# Patient Record
Sex: Female | Born: 1935 | Race: White | Hispanic: No | Smoking: Former smoker
Health system: Southern US, Community
[De-identification: ages and names within clinical notes are randomized; demographics above are authoritative.]

## PROBLEM LIST (undated history)

## (undated) DIAGNOSIS — D649 Anemia, unspecified: Secondary | ICD-10-CM

## (undated) DIAGNOSIS — E039 Hypothyroidism, unspecified: Secondary | ICD-10-CM

## (undated) DIAGNOSIS — Z8679 Personal history of other diseases of the circulatory system: Secondary | ICD-10-CM

## (undated) DIAGNOSIS — R739 Hyperglycemia, unspecified: Secondary | ICD-10-CM

## (undated) DIAGNOSIS — I509 Heart failure, unspecified: Secondary | ICD-10-CM

## (undated) DIAGNOSIS — N289 Disorder of kidney and ureter, unspecified: Secondary | ICD-10-CM

## (undated) HISTORY — DX: Hypothyroidism, unspecified: E03.9

## (undated) HISTORY — DX: Anemia, unspecified: D64.9

## (undated) HISTORY — DX: Heart failure, unspecified: I50.9

## (undated) HISTORY — DX: Disorder of kidney and ureter, unspecified: N28.9

## (undated) HISTORY — DX: Hyperglycemia, unspecified: R73.9

## (undated) HISTORY — DX: Personal history of other diseases of the circulatory system: Z86.79

---

## 2005-03-17 ENCOUNTER — Ambulatory Visit: Payer: Self-pay | Admitting: Cardiology

## 2005-03-23 ENCOUNTER — Ambulatory Visit (HOSPITAL_COMMUNITY): Admission: RE | Admit: 2005-03-23 | Discharge: 2005-03-23 | Payer: Self-pay | Admitting: Cardiology

## 2005-04-11 ENCOUNTER — Emergency Department (HOSPITAL_COMMUNITY): Admission: EM | Admit: 2005-04-11 | Discharge: 2005-04-11 | Payer: Self-pay | Admitting: Emergency Medicine

## 2005-04-11 ENCOUNTER — Emergency Department: Payer: Self-pay | Admitting: Emergency Medicine

## 2005-04-15 ENCOUNTER — Ambulatory Visit: Payer: Self-pay | Admitting: Family Medicine

## 2005-05-15 ENCOUNTER — Ambulatory Visit: Payer: Self-pay | Admitting: Family Medicine

## 2005-05-27 ENCOUNTER — Ambulatory Visit: Payer: Self-pay

## 2005-06-16 ENCOUNTER — Ambulatory Visit: Payer: Self-pay | Admitting: Family Medicine

## 2005-06-22 ENCOUNTER — Ambulatory Visit: Payer: Self-pay

## 2005-07-07 ENCOUNTER — Ambulatory Visit: Payer: Self-pay | Admitting: Family Medicine

## 2005-07-09 ENCOUNTER — Ambulatory Visit (HOSPITAL_COMMUNITY): Admission: RE | Admit: 2005-07-09 | Discharge: 2005-07-09 | Payer: Self-pay | Admitting: Family Medicine

## 2005-07-09 ENCOUNTER — Encounter (HOSPITAL_COMMUNITY): Admission: RE | Admit: 2005-07-09 | Discharge: 2005-08-08 | Payer: Self-pay | Admitting: Family Medicine

## 2005-07-09 ENCOUNTER — Ambulatory Visit: Payer: Self-pay | Admitting: Psychology

## 2005-08-03 ENCOUNTER — Ambulatory Visit: Payer: Self-pay | Admitting: Cardiology

## 2005-08-14 ENCOUNTER — Emergency Department (HOSPITAL_COMMUNITY): Admission: EM | Admit: 2005-08-14 | Discharge: 2005-08-14 | Payer: Self-pay | Admitting: Emergency Medicine

## 2005-08-14 ENCOUNTER — Ambulatory Visit: Payer: Self-pay | Admitting: Family Medicine

## 2005-08-17 ENCOUNTER — Ambulatory Visit: Payer: Self-pay | Admitting: Family Medicine

## 2005-08-19 ENCOUNTER — Ambulatory Visit: Payer: Self-pay | Admitting: Cardiology

## 2005-08-20 ENCOUNTER — Ambulatory Visit (HOSPITAL_COMMUNITY): Admission: RE | Admit: 2005-08-20 | Discharge: 2005-08-20 | Payer: Self-pay | Admitting: Family Medicine

## 2005-08-25 ENCOUNTER — Ambulatory Visit: Payer: Self-pay | Admitting: Cardiology

## 2005-08-26 ENCOUNTER — Ambulatory Visit: Payer: Self-pay | Admitting: Cardiology

## 2005-08-26 ENCOUNTER — Ambulatory Visit (HOSPITAL_COMMUNITY): Admission: RE | Admit: 2005-08-26 | Discharge: 2005-08-26 | Payer: Self-pay | Admitting: Cardiology

## 2005-08-26 ENCOUNTER — Ambulatory Visit: Payer: Self-pay | Admitting: Psychology

## 2005-09-09 ENCOUNTER — Ambulatory Visit (HOSPITAL_COMMUNITY): Admission: RE | Admit: 2005-09-09 | Discharge: 2005-09-09 | Payer: Self-pay | Admitting: Cardiology

## 2005-09-09 ENCOUNTER — Ambulatory Visit: Payer: Self-pay | Admitting: *Deleted

## 2005-09-18 ENCOUNTER — Ambulatory Visit: Payer: Self-pay | Admitting: Internal Medicine

## 2005-09-18 ENCOUNTER — Ambulatory Visit: Payer: Self-pay | Admitting: Family Medicine

## 2005-12-08 ENCOUNTER — Ambulatory Visit: Payer: Self-pay | Admitting: Family Medicine

## 2006-01-08 ENCOUNTER — Ambulatory Visit: Payer: Self-pay | Admitting: Family Medicine

## 2006-02-11 ENCOUNTER — Emergency Department (HOSPITAL_COMMUNITY): Admission: EM | Admit: 2006-02-11 | Discharge: 2006-02-11 | Payer: Self-pay | Admitting: Emergency Medicine

## 2006-02-11 ENCOUNTER — Ambulatory Visit: Payer: Self-pay | Admitting: Family Medicine

## 2006-02-18 ENCOUNTER — Ambulatory Visit: Payer: Self-pay | Admitting: Cardiology

## 2006-03-04 ENCOUNTER — Ambulatory Visit (HOSPITAL_COMMUNITY): Admission: RE | Admit: 2006-03-04 | Discharge: 2006-03-04 | Payer: Self-pay | Admitting: Obstetrics & Gynecology

## 2006-03-11 ENCOUNTER — Ambulatory Visit: Payer: Self-pay | Admitting: Cardiology

## 2006-03-17 ENCOUNTER — Ambulatory Visit: Admission: RE | Admit: 2006-03-17 | Discharge: 2006-03-17 | Payer: Self-pay | Admitting: Gynecologic Oncology

## 2006-03-23 ENCOUNTER — Inpatient Hospital Stay (HOSPITAL_COMMUNITY): Admission: RE | Admit: 2006-03-23 | Discharge: 2006-03-26 | Payer: Self-pay | Admitting: Obstetrics & Gynecology

## 2006-04-02 ENCOUNTER — Emergency Department (HOSPITAL_COMMUNITY): Admission: EM | Admit: 2006-04-02 | Discharge: 2006-04-02 | Payer: Self-pay | Admitting: Emergency Medicine

## 2006-04-05 ENCOUNTER — Ambulatory Visit: Payer: Self-pay | Admitting: Family Medicine

## 2006-04-19 ENCOUNTER — Ambulatory Visit: Payer: Self-pay | Admitting: Family Medicine

## 2006-04-29 ENCOUNTER — Ambulatory Visit: Payer: Self-pay | Admitting: Family Medicine

## 2006-05-03 ENCOUNTER — Ambulatory Visit: Payer: Self-pay | Admitting: Family Medicine

## 2006-05-04 ENCOUNTER — Ambulatory Visit: Admission: RE | Admit: 2006-05-04 | Discharge: 2006-05-04 | Payer: Self-pay | Admitting: Gynecologic Oncology

## 2006-05-05 ENCOUNTER — Encounter: Admission: RE | Admit: 2006-05-05 | Discharge: 2006-05-05 | Payer: Self-pay | Admitting: Oncology

## 2006-05-05 ENCOUNTER — Ambulatory Visit (HOSPITAL_COMMUNITY): Payer: Self-pay | Admitting: Oncology

## 2006-05-05 ENCOUNTER — Encounter (HOSPITAL_COMMUNITY): Admission: RE | Admit: 2006-05-05 | Discharge: 2006-06-04 | Payer: Self-pay | Admitting: Oncology

## 2006-05-10 ENCOUNTER — Ambulatory Visit (HOSPITAL_COMMUNITY): Admission: RE | Admit: 2006-05-10 | Discharge: 2006-05-10 | Payer: Self-pay | Admitting: Oncology

## 2006-05-11 ENCOUNTER — Ambulatory Visit (HOSPITAL_COMMUNITY): Admission: RE | Admit: 2006-05-11 | Discharge: 2006-05-11 | Payer: Self-pay | Admitting: General Surgery

## 2006-05-20 ENCOUNTER — Other Ambulatory Visit: Payer: Self-pay

## 2006-05-20 ENCOUNTER — Emergency Department: Payer: Self-pay | Admitting: Emergency Medicine

## 2006-05-25 ENCOUNTER — Emergency Department (HOSPITAL_COMMUNITY): Admission: EM | Admit: 2006-05-25 | Discharge: 2006-05-25 | Payer: Self-pay | Admitting: Emergency Medicine

## 2006-05-25 ENCOUNTER — Ambulatory Visit: Payer: Self-pay | Admitting: Cardiology

## 2006-06-15 ENCOUNTER — Inpatient Hospital Stay (HOSPITAL_COMMUNITY): Admission: AD | Admit: 2006-06-15 | Discharge: 2006-06-24 | Payer: Self-pay | Admitting: Internal Medicine

## 2006-06-16 ENCOUNTER — Ambulatory Visit: Payer: Self-pay | Admitting: *Deleted

## 2006-07-19 ENCOUNTER — Ambulatory Visit: Payer: Self-pay | Admitting: Internal Medicine

## 2006-07-28 ENCOUNTER — Ambulatory Visit: Payer: Self-pay | Admitting: Internal Medicine

## 2006-08-28 ENCOUNTER — Ambulatory Visit: Payer: Self-pay | Admitting: Internal Medicine

## 2006-09-17 ENCOUNTER — Ambulatory Visit: Payer: Self-pay | Admitting: Internal Medicine

## 2006-09-27 ENCOUNTER — Ambulatory Visit: Payer: Self-pay | Admitting: Internal Medicine

## 2006-10-28 ENCOUNTER — Ambulatory Visit: Payer: Self-pay | Admitting: Internal Medicine

## 2006-11-27 ENCOUNTER — Ambulatory Visit: Payer: Self-pay | Admitting: Internal Medicine

## 2006-12-28 ENCOUNTER — Ambulatory Visit: Payer: Self-pay | Admitting: Internal Medicine

## 2007-01-28 ENCOUNTER — Ambulatory Visit: Payer: Self-pay | Admitting: Internal Medicine

## 2007-02-26 ENCOUNTER — Ambulatory Visit: Payer: Self-pay | Admitting: Internal Medicine

## 2007-03-29 ENCOUNTER — Ambulatory Visit: Payer: Self-pay | Admitting: Internal Medicine

## 2007-04-04 ENCOUNTER — Ambulatory Visit: Payer: Self-pay | Admitting: Internal Medicine

## 2007-04-06 ENCOUNTER — Ambulatory Visit: Payer: Self-pay | Admitting: Internal Medicine

## 2007-04-28 ENCOUNTER — Ambulatory Visit: Payer: Self-pay | Admitting: Internal Medicine

## 2007-05-18 ENCOUNTER — Emergency Department: Payer: Self-pay | Admitting: Emergency Medicine

## 2007-06-28 ENCOUNTER — Ambulatory Visit: Payer: Self-pay | Admitting: Internal Medicine

## 2007-07-06 ENCOUNTER — Ambulatory Visit: Payer: Self-pay | Admitting: Internal Medicine

## 2007-07-29 ENCOUNTER — Ambulatory Visit: Payer: Self-pay | Admitting: Internal Medicine

## 2007-08-27 ENCOUNTER — Emergency Department: Payer: Self-pay | Admitting: Emergency Medicine

## 2007-10-18 ENCOUNTER — Ambulatory Visit: Payer: Self-pay | Admitting: Internal Medicine

## 2007-10-27 ENCOUNTER — Ambulatory Visit: Payer: Self-pay | Admitting: Internal Medicine

## 2007-10-29 ENCOUNTER — Ambulatory Visit: Payer: Self-pay | Admitting: Internal Medicine

## 2007-11-28 ENCOUNTER — Ambulatory Visit: Payer: Self-pay | Admitting: Internal Medicine

## 2007-11-29 ENCOUNTER — Ambulatory Visit: Payer: Self-pay | Admitting: Internal Medicine

## 2007-12-29 ENCOUNTER — Encounter: Payer: Self-pay | Admitting: Family Medicine

## 2008-01-02 ENCOUNTER — Ambulatory Visit: Payer: Self-pay | Admitting: Internal Medicine

## 2008-01-04 ENCOUNTER — Ambulatory Visit: Payer: Self-pay | Admitting: Internal Medicine

## 2008-01-29 ENCOUNTER — Ambulatory Visit: Payer: Self-pay | Admitting: Internal Medicine

## 2008-01-30 ENCOUNTER — Emergency Department: Payer: Self-pay | Admitting: Internal Medicine

## 2008-02-01 ENCOUNTER — Other Ambulatory Visit: Payer: Self-pay

## 2008-02-01 ENCOUNTER — Emergency Department: Payer: Self-pay | Admitting: Emergency Medicine

## 2008-03-07 ENCOUNTER — Ambulatory Visit: Payer: Self-pay | Admitting: Ophthalmology

## 2008-03-07 ENCOUNTER — Other Ambulatory Visit: Payer: Self-pay

## 2008-03-09 ENCOUNTER — Ambulatory Visit: Payer: Self-pay | Admitting: Internal Medicine

## 2008-03-13 ENCOUNTER — Inpatient Hospital Stay: Payer: Self-pay | Admitting: Internal Medicine

## 2008-03-14 ENCOUNTER — Other Ambulatory Visit: Payer: Self-pay

## 2008-03-28 ENCOUNTER — Ambulatory Visit: Payer: Self-pay | Admitting: Internal Medicine

## 2008-04-09 ENCOUNTER — Ambulatory Visit: Payer: Self-pay | Admitting: Internal Medicine

## 2008-04-26 ENCOUNTER — Other Ambulatory Visit: Payer: Self-pay

## 2008-04-26 ENCOUNTER — Emergency Department: Payer: Self-pay | Admitting: Emergency Medicine

## 2008-04-30 ENCOUNTER — Other Ambulatory Visit: Payer: Self-pay

## 2008-04-30 ENCOUNTER — Emergency Department: Payer: Self-pay | Admitting: Emergency Medicine

## 2008-05-01 ENCOUNTER — Ambulatory Visit: Payer: Self-pay | Admitting: Ophthalmology

## 2008-05-02 ENCOUNTER — Ambulatory Visit: Payer: Self-pay | Admitting: Internal Medicine

## 2008-05-04 ENCOUNTER — Ambulatory Visit: Payer: Self-pay | Admitting: Otolaryngology

## 2008-05-09 ENCOUNTER — Ambulatory Visit: Payer: Self-pay | Admitting: Ophthalmology

## 2008-06-04 ENCOUNTER — Ambulatory Visit: Payer: Self-pay | Admitting: Internal Medicine

## 2008-06-11 ENCOUNTER — Ambulatory Visit: Payer: Self-pay | Admitting: Internal Medicine

## 2008-06-18 ENCOUNTER — Inpatient Hospital Stay: Payer: Self-pay | Admitting: Internal Medicine

## 2008-06-18 ENCOUNTER — Other Ambulatory Visit: Payer: Self-pay

## 2008-06-27 ENCOUNTER — Ambulatory Visit: Payer: Self-pay | Admitting: Internal Medicine

## 2008-08-24 ENCOUNTER — Inpatient Hospital Stay: Payer: Self-pay | Admitting: Internal Medicine

## 2008-09-30 ENCOUNTER — Inpatient Hospital Stay: Payer: Self-pay | Admitting: Internal Medicine

## 2008-09-30 ENCOUNTER — Other Ambulatory Visit: Payer: Self-pay

## 2009-04-17 ENCOUNTER — Ambulatory Visit: Payer: Self-pay | Admitting: Internal Medicine

## 2009-04-27 ENCOUNTER — Ambulatory Visit: Payer: Self-pay | Admitting: Internal Medicine

## 2009-06-27 ENCOUNTER — Ambulatory Visit: Payer: Self-pay | Admitting: Internal Medicine

## 2009-07-04 ENCOUNTER — Ambulatory Visit: Payer: Self-pay | Admitting: Internal Medicine

## 2009-07-28 ENCOUNTER — Ambulatory Visit: Payer: Self-pay | Admitting: Internal Medicine

## 2009-08-27 IMAGING — CR DG CHEST 1V PORT
1 series · 1 of 1 positions shown · non-contrast
Comparison: none

REASON FOR EXAM: Chest Pain
COMMENTS:

[view not recorded]
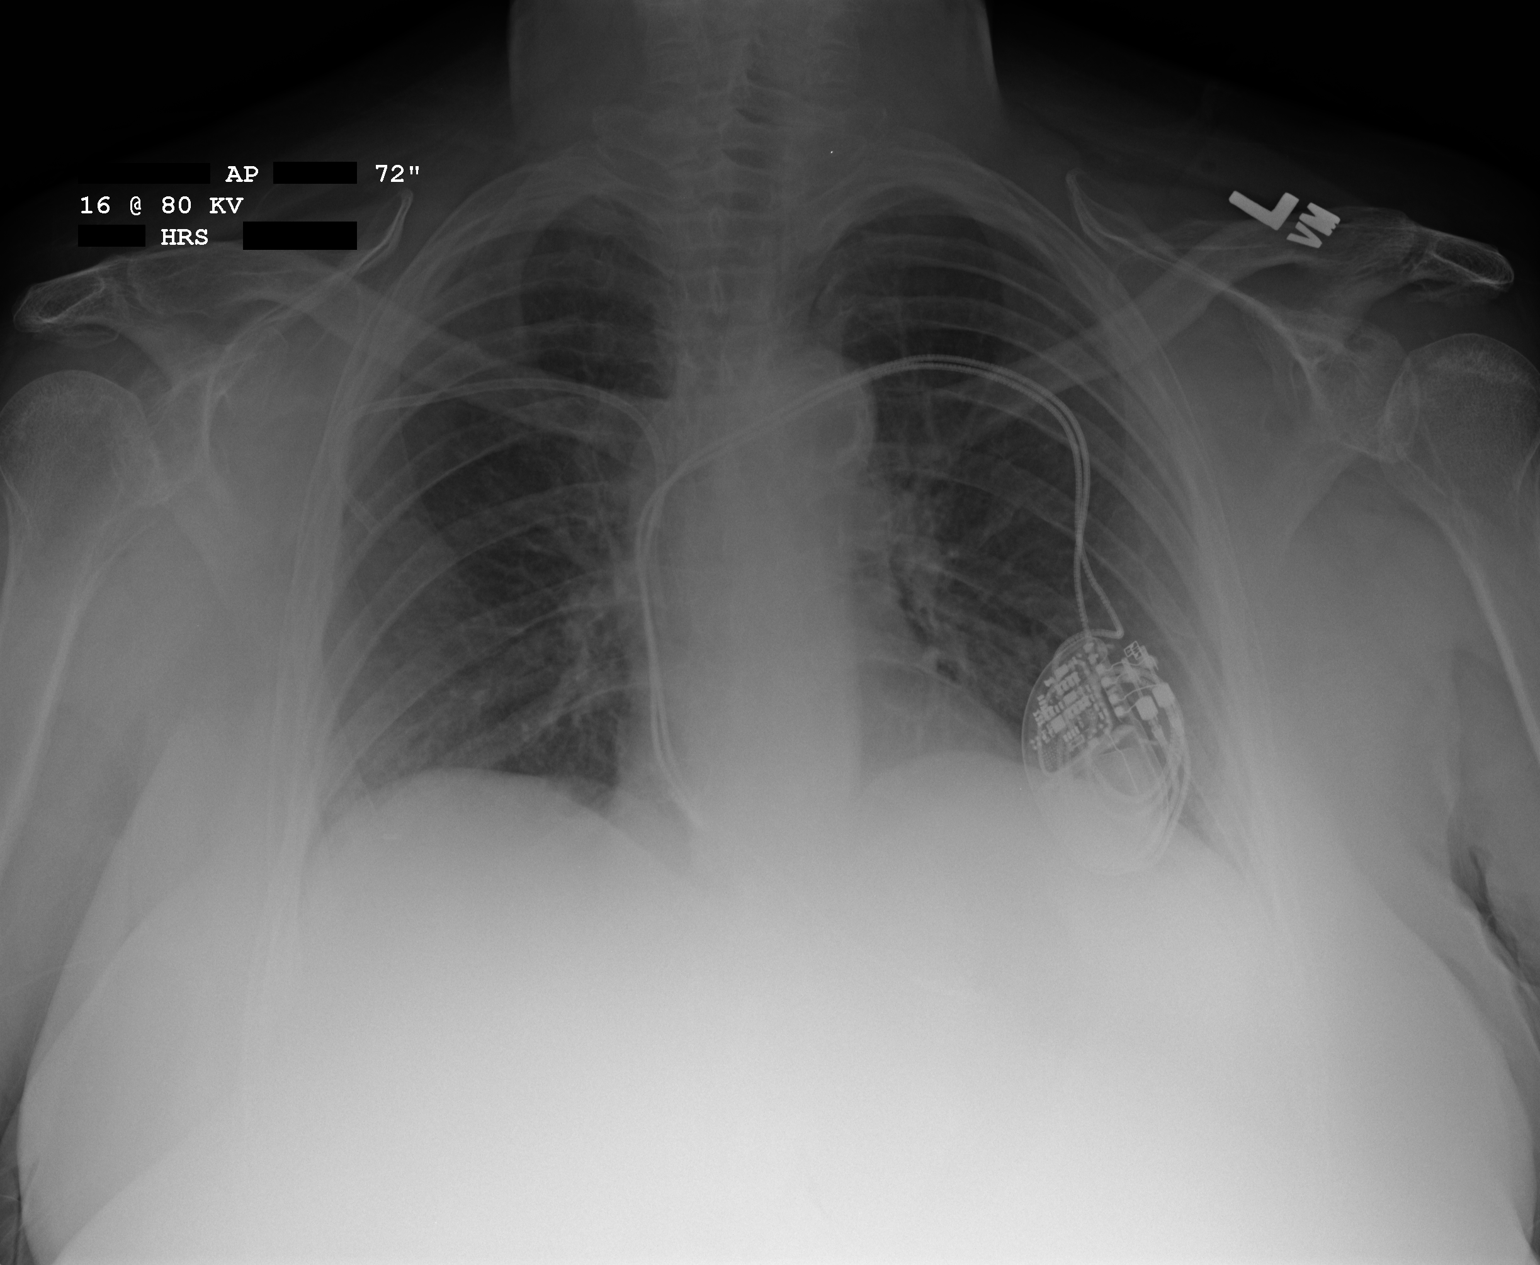

[1 of 1 positions shown; findings below may reference images not displayed]

PROCEDURE:     DXR - DXR PORTABLE CHEST SINGLE VIEW  - June 18, 2008  [DATE]

RESULT:     Comparison is made to a prior study dated 04/26/08.

The patient has taken a shallow inspiration. Otherwise, there is no evidence
of focal infiltrates, effusions or edema.  A LEFT sided pectoralis pacing
unit is appreciated.  The cardiac silhouette is within normal limits.  The
visualized bony skeleton is unremarkable.  A RIGHT sided central venous
catheter is appreciated with the tip projected in the region of the superior
vena cava.
IMPRESSION: Shallow inspiration without evidence of acute cardiopulmonary disease.

## 2009-09-09 ENCOUNTER — Ambulatory Visit: Payer: Self-pay | Admitting: Internal Medicine

## 2009-09-15 ENCOUNTER — Emergency Department: Payer: Self-pay | Admitting: Unknown Physician Specialty

## 2009-09-16 ENCOUNTER — Emergency Department: Payer: Self-pay | Admitting: Emergency Medicine

## 2009-09-27 ENCOUNTER — Ambulatory Visit: Payer: Self-pay | Admitting: Internal Medicine

## 2009-10-28 ENCOUNTER — Ambulatory Visit: Payer: Self-pay | Admitting: Internal Medicine

## 2009-11-27 ENCOUNTER — Ambulatory Visit: Payer: Self-pay | Admitting: Internal Medicine

## 2009-12-28 ENCOUNTER — Ambulatory Visit: Payer: Self-pay | Admitting: Internal Medicine

## 2010-01-21 ENCOUNTER — Ambulatory Visit: Payer: Self-pay | Admitting: Internal Medicine

## 2010-01-30 ENCOUNTER — Ambulatory Visit: Payer: Self-pay | Admitting: Internal Medicine

## 2010-01-31 ENCOUNTER — Ambulatory Visit: Payer: Self-pay | Admitting: Internal Medicine

## 2010-02-25 ENCOUNTER — Ambulatory Visit: Payer: Self-pay | Admitting: Internal Medicine

## 2010-03-18 ENCOUNTER — Ambulatory Visit: Payer: Self-pay | Admitting: Otolaryngology

## 2010-04-03 ENCOUNTER — Ambulatory Visit: Payer: Self-pay | Admitting: Internal Medicine

## 2010-04-05 ENCOUNTER — Inpatient Hospital Stay: Payer: Self-pay | Admitting: Orthopedic Surgery

## 2010-04-09 ENCOUNTER — Ambulatory Visit: Payer: Self-pay | Admitting: Cardiology

## 2010-04-27 ENCOUNTER — Ambulatory Visit: Payer: Self-pay | Admitting: Internal Medicine

## 2010-05-28 ENCOUNTER — Ambulatory Visit: Payer: Self-pay | Admitting: Internal Medicine

## 2010-06-27 ENCOUNTER — Ambulatory Visit: Payer: Self-pay | Admitting: Internal Medicine

## 2010-08-08 ENCOUNTER — Ambulatory Visit: Payer: Self-pay | Admitting: Otolaryngology

## 2010-08-21 ENCOUNTER — Ambulatory Visit: Payer: Self-pay | Admitting: Internal Medicine

## 2010-08-28 ENCOUNTER — Ambulatory Visit: Payer: Self-pay | Admitting: Internal Medicine

## 2010-09-08 ENCOUNTER — Ambulatory Visit: Payer: Self-pay | Admitting: Otolaryngology

## 2010-09-26 ENCOUNTER — Ambulatory Visit: Payer: Self-pay | Admitting: Internal Medicine

## 2010-09-27 ENCOUNTER — Ambulatory Visit: Payer: Self-pay | Admitting: Internal Medicine

## 2010-10-28 ENCOUNTER — Ambulatory Visit: Payer: Self-pay | Admitting: Internal Medicine

## 2010-11-27 ENCOUNTER — Ambulatory Visit: Payer: Self-pay | Admitting: Internal Medicine

## 2010-12-28 ENCOUNTER — Ambulatory Visit: Payer: Self-pay | Admitting: Internal Medicine

## 2011-01-09 ENCOUNTER — Emergency Department: Payer: Self-pay | Admitting: Emergency Medicine

## 2011-01-18 ENCOUNTER — Encounter: Payer: Self-pay | Admitting: Family Medicine

## 2011-01-22 ENCOUNTER — Ambulatory Visit: Payer: Self-pay | Admitting: Internal Medicine

## 2011-01-26 ENCOUNTER — Emergency Department: Payer: Self-pay | Admitting: Unknown Physician Specialty

## 2011-01-27 NOTE — Letter (Signed)
Summary: rpc chart  rpc chart   Imported By: Curtis Sites 09/29/2010 09:57:23  _____________________________________________________________________  External Attachment:    Type:   Image     Comment:   External Document

## 2011-01-28 ENCOUNTER — Ambulatory Visit: Payer: Self-pay | Admitting: Internal Medicine

## 2011-02-26 ENCOUNTER — Ambulatory Visit: Payer: Self-pay | Admitting: Internal Medicine

## 2011-03-29 ENCOUNTER — Ambulatory Visit: Payer: Self-pay | Admitting: Internal Medicine

## 2011-04-28 ENCOUNTER — Ambulatory Visit: Payer: Self-pay | Admitting: Internal Medicine

## 2011-05-29 ENCOUNTER — Ambulatory Visit: Payer: Self-pay | Admitting: Internal Medicine

## 2011-06-28 ENCOUNTER — Ambulatory Visit: Payer: Self-pay | Admitting: Internal Medicine

## 2011-07-29 ENCOUNTER — Ambulatory Visit: Payer: Self-pay | Admitting: Internal Medicine

## 2011-08-29 ENCOUNTER — Ambulatory Visit: Payer: Self-pay

## 2011-08-29 ENCOUNTER — Ambulatory Visit: Payer: Self-pay | Admitting: Internal Medicine

## 2011-09-28 ENCOUNTER — Ambulatory Visit: Payer: Self-pay

## 2011-10-20 ENCOUNTER — Ambulatory Visit: Payer: Self-pay | Admitting: Internal Medicine

## 2011-10-26 ENCOUNTER — Emergency Department: Payer: Self-pay | Admitting: Emergency Medicine

## 2011-10-29 ENCOUNTER — Ambulatory Visit: Payer: Self-pay | Admitting: Internal Medicine

## 2011-11-28 ENCOUNTER — Ambulatory Visit: Payer: Self-pay | Admitting: Internal Medicine

## 2011-12-29 ENCOUNTER — Ambulatory Visit: Payer: Self-pay | Admitting: Internal Medicine

## 2012-01-21 ENCOUNTER — Emergency Department: Payer: Self-pay | Admitting: *Deleted

## 2012-01-21 LAB — CBC
HCT: 36.4 % (ref 35.0–47.0)
MCH: 29.5 pg (ref 26.0–34.0)
MCHC: 32.5 g/dL (ref 32.0–36.0)
RBC: 4.01 10*6/uL (ref 3.80–5.20)
RDW: 15.5 % — ABNORMAL HIGH (ref 11.5–14.5)
WBC: 8.6 10*3/uL (ref 3.6–11.0)

## 2012-01-21 LAB — PRO B NATRIURETIC PEPTIDE: B-Type Natriuretic Peptide: 6221 pg/mL — ABNORMAL HIGH (ref 0–450)

## 2012-01-21 LAB — COMPREHENSIVE METABOLIC PANEL
Albumin: 3.1 g/dL — ABNORMAL LOW (ref 3.4–5.0)
Alkaline Phosphatase: 117 U/L (ref 50–136)
Anion Gap: 8 (ref 7–16)
BUN: 12 mg/dL (ref 7–18)
Bilirubin,Total: 0.7 mg/dL (ref 0.2–1.0)
Calcium, Total: 9.2 mg/dL (ref 8.5–10.1)
Creatinine: 1.05 mg/dL (ref 0.60–1.30)
EGFR (African American): >60
EGFR (Non-African Amer.): 54 — ABNORMAL LOW
Glucose: 141 mg/dL — ABNORMAL HIGH (ref 65–99)
Osmolality: 278 (ref 275–301)
Potassium: 4.7 mmol/L (ref 3.5–5.1)
SGOT(AST): 30 U/L (ref 15–37)
Sodium: 138 mmol/L (ref 136–145)
Total Protein: 7.5 g/dL (ref 6.4–8.2)

## 2012-01-21 LAB — TROPONIN I: Troponin-I: <0.02 ng/mL

## 2012-01-21 LAB — CK TOTAL AND CKMB (NOT AT ARMC): CK, Total: 214 U/L (ref 21–215)

## 2012-01-29 ENCOUNTER — Ambulatory Visit: Payer: Self-pay | Admitting: Internal Medicine

## 2012-02-26 ENCOUNTER — Ambulatory Visit: Payer: Self-pay | Admitting: Internal Medicine

## 2012-03-29 ENCOUNTER — Ambulatory Visit: Payer: Self-pay | Admitting: Internal Medicine

## 2012-04-08 ENCOUNTER — Emergency Department: Payer: Self-pay | Admitting: Emergency Medicine

## 2012-04-14 LAB — CBC WITH DIFFERENTIAL/PLATELET
Basophil #: 0 10*3/uL (ref 0.0–0.1)
Basophil %: 0.6 %
Eosinophil #: 0.1 10*3/uL (ref 0.0–0.7)
Eosinophil %: 1.9 %
HGB: 8.9 g/dL — ABNORMAL LOW (ref 12.0–16.0)
MCH: 28.6 pg (ref 26.0–34.0)
MCV: 90 fL (ref 80–100)
Monocyte #: 0.8 x10 3/mm (ref 0.2–0.9)
Monocyte %: 11.9 %
Neutrophil #: 4.9 10*3/uL (ref 1.4–6.5)
Platelet: 293 10*3/uL (ref 150–440)
RBC: 3.1 10*6/uL — ABNORMAL LOW (ref 3.80–5.20)

## 2012-04-14 LAB — COMPREHENSIVE METABOLIC PANEL
BUN: 17 mg/dL (ref 7–18)
Bilirubin,Total: 0.5 mg/dL (ref 0.2–1.0)
Calcium, Total: 8.5 mg/dL (ref 8.5–10.1)
Co2: 28 mmol/L (ref 21–32)
EGFR (Non-African Amer.): 39 — ABNORMAL LOW
Glucose: 108 mg/dL — ABNORMAL HIGH (ref 65–99)
Potassium: 4.4 mmol/L (ref 3.5–5.1)
SGOT(AST): 26 U/L (ref 15–37)
SGPT (ALT): 22 U/L
Total Protein: 6.5 g/dL (ref 6.4–8.2)

## 2012-04-15 ENCOUNTER — Inpatient Hospital Stay: Payer: Self-pay | Admitting: Internal Medicine

## 2012-04-15 DIAGNOSIS — I059 Rheumatic mitral valve disease, unspecified: Secondary | ICD-10-CM

## 2012-04-15 LAB — URINALYSIS, COMPLETE
Bilirubin,UR: NEGATIVE
Nitrite: NEGATIVE
Ph: 5 (ref 4.5–8.0)
Protein: 30
RBC,UR: 21 /HPF (ref 0–5)
Specific Gravity: 1.023 (ref 1.003–1.030)
Squamous Epithelial: 1

## 2012-04-15 LAB — RETICULOCYTES
Absolute Retic Count: 0.067 10*6/uL (ref 0.024–0.084)
Reticulocyte: 2.14 % — ABNORMAL HIGH (ref 0.5–1.5)

## 2012-04-15 LAB — OCCULT BLOOD X 1 CARD TO LAB, STOOL: Occult Blood, Feces: NEGATIVE

## 2012-04-15 LAB — PROTIME-INR: INR: 3

## 2012-04-15 LAB — IRON AND TIBC
Iron: 98 ug/dL (ref 50–170)
Unbound Iron-Bind.Cap.: 227 ug/dL

## 2012-04-16 LAB — BASIC METABOLIC PANEL
Anion Gap: 10 (ref 7–16)
Calcium, Total: 8.6 mg/dL (ref 8.5–10.1)
Chloride: 98 mmol/L (ref 98–107)
Creatinine: 1.51 mg/dL — ABNORMAL HIGH (ref 0.60–1.30)
Osmolality: 290 (ref 275–301)
Potassium: 5.6 mmol/L — ABNORMAL HIGH (ref 3.5–5.1)
Sodium: 136 mmol/L (ref 136–145)

## 2012-04-16 LAB — CBC WITH DIFFERENTIAL/PLATELET
Basophil %: 0.4 %
Eosinophil %: 0.3 %
HCT: 28.7 % — ABNORMAL LOW (ref 35.0–47.0)
Lymphocyte %: 6.5 %
MCH: 28.6 pg (ref 26.0–34.0)
MCHC: 31.7 g/dL — ABNORMAL LOW (ref 32.0–36.0)
Neutrophil #: 7.7 10*3/uL — ABNORMAL HIGH (ref 1.4–6.5)
Neutrophil %: 82.8 %
Platelet: 292 10*3/uL (ref 150–440)
RDW: 16.1 % — ABNORMAL HIGH (ref 11.5–14.5)
WBC: 9.4 10*3/uL (ref 3.6–11.0)

## 2012-04-16 LAB — HEMOGLOBIN A1C: Hemoglobin A1C: 7.8 % — ABNORMAL HIGH (ref 4.2–6.3)

## 2012-04-16 LAB — PROTIME-INR: INR: 2.6

## 2012-04-16 LAB — POTASSIUM: Potassium: 4.3 mmol/L (ref 3.5–5.1)

## 2012-04-16 LAB — URINE CULTURE

## 2012-04-17 LAB — BASIC METABOLIC PANEL
Anion Gap: 8 (ref 7–16)
BUN: 14 mg/dL (ref 7–18)
Chloride: 98 mmol/L (ref 98–107)
Co2: 34 mmol/L — ABNORMAL HIGH (ref 21–32)
Creatinine: 1.28 mg/dL (ref 0.60–1.30)
EGFR (African American): 47 — ABNORMAL LOW
Osmolality: 282 (ref 275–301)
Potassium: 3.6 mmol/L (ref 3.5–5.1)
Sodium: 140 mmol/L (ref 136–145)

## 2012-04-17 LAB — PROTIME-INR: INR: 2.7

## 2012-04-17 LAB — CBC WITH DIFFERENTIAL/PLATELET
Basophil #: 0 10*3/uL (ref 0.0–0.1)
Basophil %: 0.5 %
Eosinophil #: 0.2 10*3/uL (ref 0.0–0.7)
Eosinophil %: 3.2 %
Lymphocyte #: 1.7 10*3/uL (ref 1.0–3.6)
MCHC: 32 g/dL (ref 32.0–36.0)
MCV: 88 fL (ref 80–100)
RBC: 3.28 10*6/uL — ABNORMAL LOW (ref 3.80–5.20)
RDW: 15.6 % — ABNORMAL HIGH (ref 11.5–14.5)
WBC: 7 10*3/uL (ref 3.6–11.0)

## 2012-04-18 LAB — CBC WITH DIFFERENTIAL/PLATELET
Basophil #: 0.1 10*3/uL (ref 0.0–0.1)
Eosinophil %: 2.7 %
HCT: 30.7 % — ABNORMAL LOW (ref 35.0–47.0)
HGB: 9.7 g/dL — ABNORMAL LOW (ref 12.0–16.0)
Lymphocyte #: 0.9 10*3/uL — ABNORMAL LOW (ref 1.0–3.6)
MCH: 28.3 pg (ref 26.0–34.0)
MCHC: 31.7 g/dL — ABNORMAL LOW (ref 32.0–36.0)
MCV: 89 fL (ref 80–100)
Monocyte %: 11.3 %
Neutrophil #: 5 10*3/uL (ref 1.4–6.5)
Neutrophil %: 72 %
Platelet: 297 10*3/uL (ref 150–440)
RBC: 3.44 10*6/uL — ABNORMAL LOW (ref 3.80–5.20)

## 2012-04-18 LAB — BASIC METABOLIC PANEL
Anion Gap: 9 (ref 7–16)
Chloride: 97 mmol/L — ABNORMAL LOW (ref 98–107)
Co2: 32 mmol/L (ref 21–32)
Creatinine: 1.16 mg/dL (ref 0.60–1.30)
EGFR (Non-African Amer.): 45 — ABNORMAL LOW
Glucose: 262 mg/dL — ABNORMAL HIGH (ref 65–99)
Osmolality: 285 (ref 275–301)
Potassium: 4 mmol/L (ref 3.5–5.1)

## 2012-04-18 LAB — PROTIME-INR
INR: 2.7
Prothrombin Time: 29 secs — ABNORMAL HIGH (ref 11.5–14.7)

## 2012-04-19 LAB — PROTIME-INR
INR: 3.1
Prothrombin Time: 31.9 secs — ABNORMAL HIGH (ref 11.5–14.7)

## 2012-04-27 ENCOUNTER — Ambulatory Visit: Payer: Self-pay | Admitting: Internal Medicine

## 2012-04-29 ENCOUNTER — Encounter: Payer: Self-pay | Admitting: Cardiovascular Disease

## 2012-05-06 ENCOUNTER — Encounter: Payer: Self-pay | Admitting: Cardiovascular Disease

## 2012-05-09 ENCOUNTER — Encounter: Payer: Self-pay | Admitting: Cardiovascular Disease

## 2012-07-07 ENCOUNTER — Ambulatory Visit: Payer: Self-pay | Admitting: Internal Medicine

## 2012-07-07 LAB — CBC CANCER CENTER
Basophil #: 0 x10 3/mm (ref 0.0–0.1)
Basophil %: 0.7 %
HCT: 36.5 % (ref 35.0–47.0)
HGB: 11.8 g/dL — ABNORMAL LOW (ref 12.0–16.0)
Lymphocyte #: 1.5 x10 3/mm (ref 1.0–3.6)
Lymphocyte %: 29.3 %
MCHC: 32.2 g/dL (ref 32.0–36.0)
MCV: 87 fL (ref 80–100)
Monocyte %: 10.4 %
Neutrophil #: 2.9 x10 3/mm (ref 1.4–6.5)
RDW: 16 % — ABNORMAL HIGH (ref 11.5–14.5)
WBC: 5.1 x10 3/mm (ref 3.6–11.0)

## 2012-07-07 LAB — LACTATE DEHYDROGENASE: LDH: 221 U/L (ref 84–246)

## 2012-07-28 ENCOUNTER — Ambulatory Visit: Payer: Self-pay | Admitting: Internal Medicine

## 2012-09-05 ENCOUNTER — Ambulatory Visit: Payer: Self-pay | Admitting: Ophthalmology

## 2012-09-05 LAB — PROTIME-INR
INR: 1.4
Prothrombin Time: 17.5 secs — ABNORMAL HIGH (ref 11.5–14.7)

## 2012-09-05 LAB — CBC WITH DIFFERENTIAL/PLATELET
Basophil #: 0 10*3/uL (ref 0.0–0.1)
Basophil %: 0.6 %
Eosinophil #: 0.2 10*3/uL (ref 0.0–0.7)
HGB: 11.9 g/dL — ABNORMAL LOW (ref 12.0–16.0)
MCH: 29.8 pg (ref 26.0–34.0)
MCHC: 33.2 g/dL (ref 32.0–36.0)
Monocyte #: 0.8 x10 3/mm (ref 0.2–0.9)
Neutrophil #: 4.4 10*3/uL (ref 1.4–6.5)
Neutrophil %: 63.3 %
RDW: 17.3 % — ABNORMAL HIGH (ref 11.5–14.5)
WBC: 7 10*3/uL (ref 3.6–11.0)

## 2012-09-05 LAB — POTASSIUM: Potassium: 4.2 mmol/L (ref 3.5–5.1)

## 2012-09-07 ENCOUNTER — Ambulatory Visit: Payer: Self-pay | Admitting: Ophthalmology

## 2012-10-17 ENCOUNTER — Emergency Department: Payer: Self-pay | Admitting: Emergency Medicine

## 2012-10-17 LAB — URINALYSIS, COMPLETE
Bilirubin,UR: NEGATIVE
Nitrite: NEGATIVE
Ph: 5 (ref 4.5–8.0)
Protein: NEGATIVE
RBC,UR: 6 /HPF (ref 0–5)
Squamous Epithelial: 1

## 2012-10-17 LAB — CBC
HCT: 35 % (ref 35.0–47.0)
HGB: 11.6 g/dL — ABNORMAL LOW (ref 12.0–16.0)
MCH: 29.8 pg (ref 26.0–34.0)
MCHC: 33.2 g/dL (ref 32.0–36.0)
MCV: 90 fL (ref 80–100)
RDW: 15.6 % — ABNORMAL HIGH (ref 11.5–14.5)

## 2012-10-17 LAB — COMPREHENSIVE METABOLIC PANEL
Albumin: 2.7 g/dL — ABNORMAL LOW (ref 3.4–5.0)
Anion Gap: 7 (ref 7–16)
Calcium, Total: 9.2 mg/dL (ref 8.5–10.1)
Chloride: 105 mmol/L (ref 98–107)
Co2: 29 mmol/L (ref 21–32)
EGFR (African American): 53 — ABNORMAL LOW
EGFR (Non-African Amer.): 45 — ABNORMAL LOW
Osmolality: 285 (ref 275–301)
Potassium: 4.1 mmol/L (ref 3.5–5.1)
SGOT(AST): 23 U/L (ref 15–37)
Sodium: 141 mmol/L (ref 136–145)

## 2012-10-17 LAB — PROTIME-INR: INR: 2

## 2012-10-17 LAB — TROPONIN I: Troponin-I: <0.02 ng/mL

## 2013-06-23 IMAGING — CR DG CHEST 1V PORT
1 series · 1 of 1 positions shown · non-contrast
Comparison: none

REASON FOR EXAM: sob
COMMENTS:

[portable]
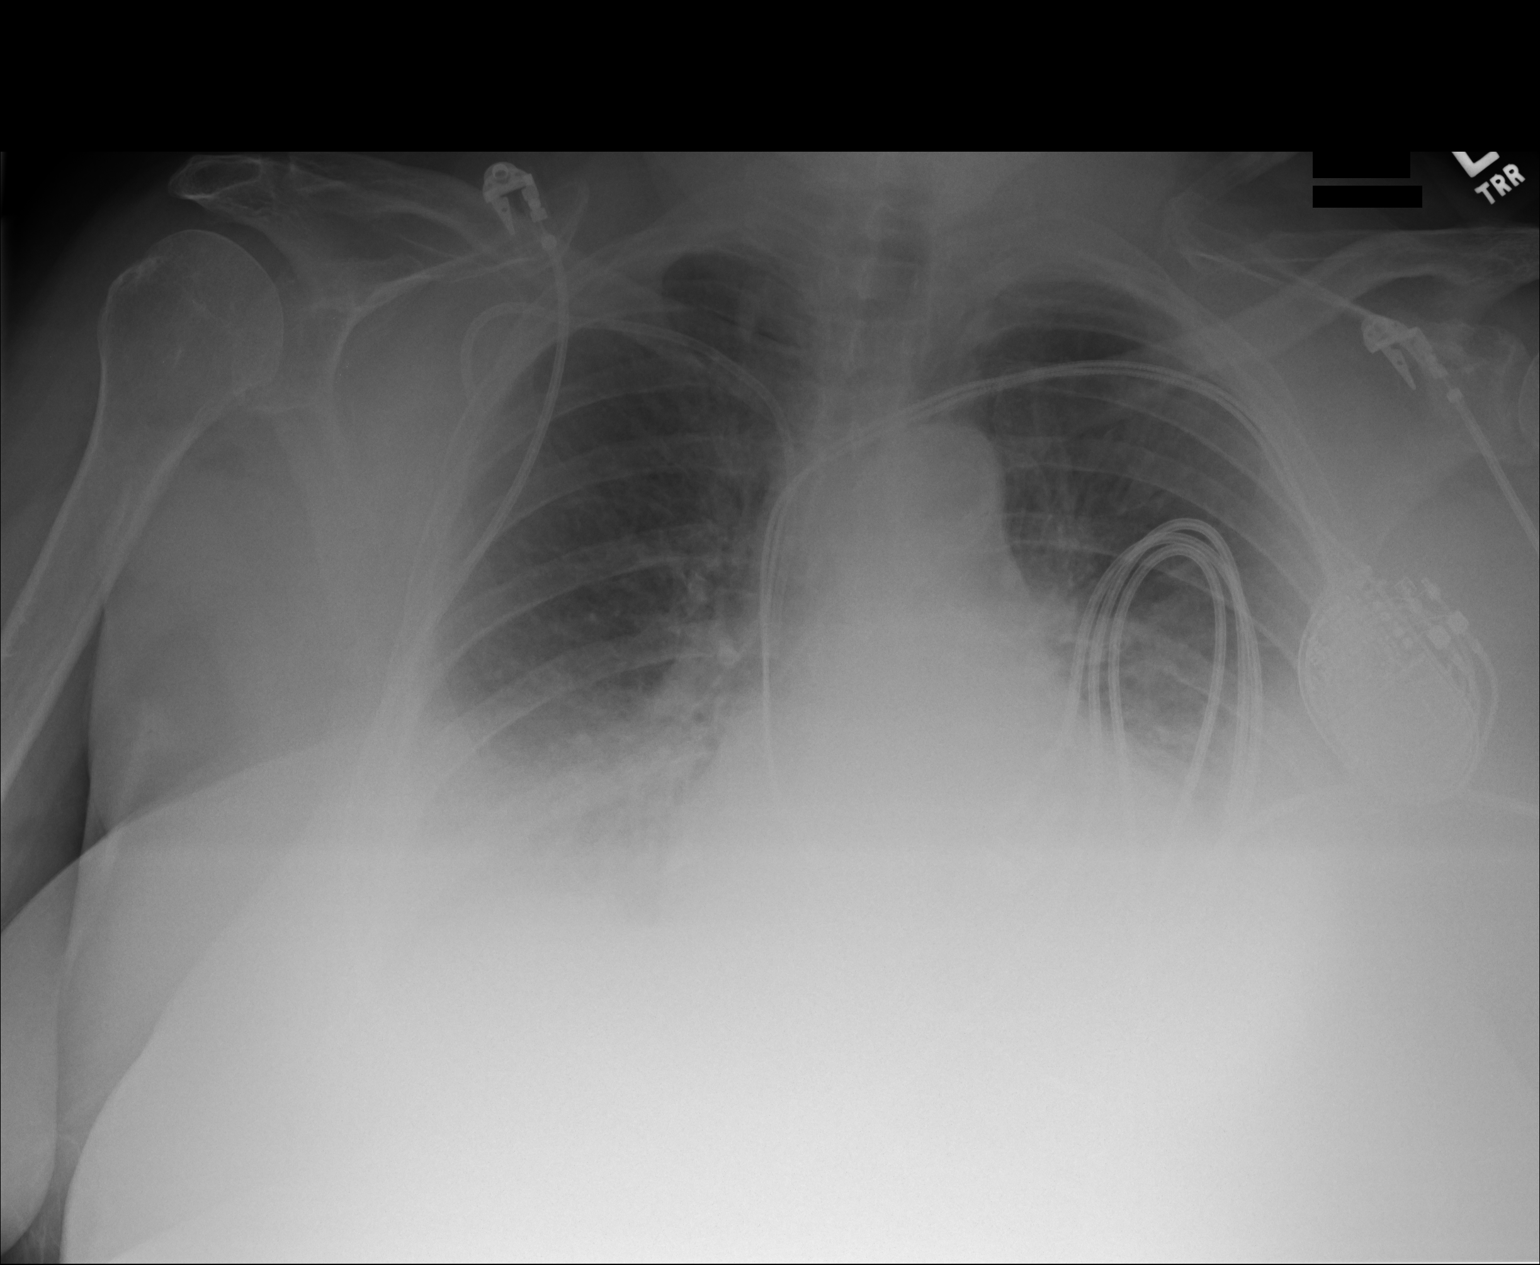

[1 of 1 positions shown; findings below may reference images not displayed]

PROCEDURE:     DXR - DXR PORTABLE CHEST SINGLE VIEW  - April 14, 2012  [DATE]

RESULT:

Comparison is made to the study 21 January, 2012.

The cardiac silhouette appears enlarged. Pacemaker device is present.
Basilar atelectasis or infiltrate is present bilaterally with small pleural
effusions. No interstitial edema or pneumothorax is evident. Right sided
central venous catheter via a subclavian approach is present with the tip in
the superior cava. Dual lead left sided pacemaker device is present.
IMPRESSION: Basilar atelectasis versus infiltrate. Follow-up PA and
lateral views of the chest would be very helpful.

## 2013-06-24 IMAGING — CT CT HEAD WITHOUT CONTRAST
2 of 4 series · 16 of 30 positions shown, 19 images · non-contrast
Comparison: none

REASON FOR EXAM: altered mental status
COMMENTS:

PROCEDURE:     CT  - CT HEAD WITHOUT CONTRAST  - April 15, 2012 [DATE]
RESULT:
TECHNIQUE: Helical noncontrast 5 mm sections were obtained from the skull
base to the vertex.
Comparison is made to a previous study dated 01/26/2011.

[Series 2: without · axial · non-contrast · 0.39mm/px · z∈[+132,+252]mm · 9 of 31 slices shown, 12 images]
[im 4/31  brain]
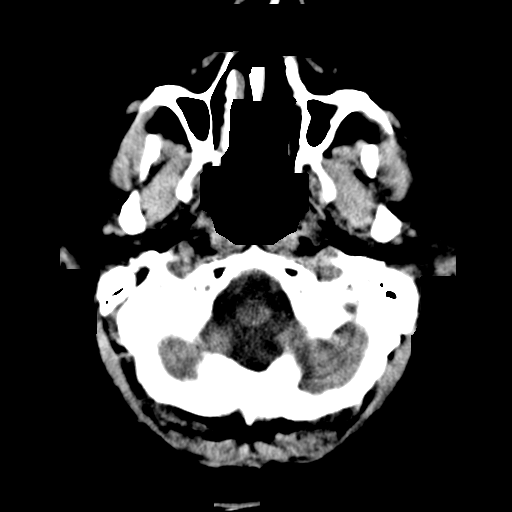
[im 4/31  bone]
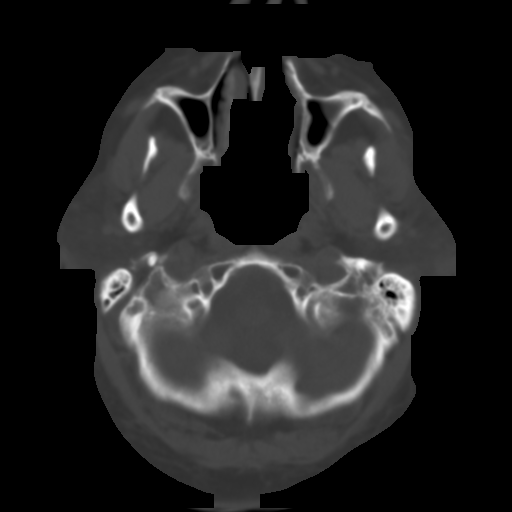
[im 7/31  brain]
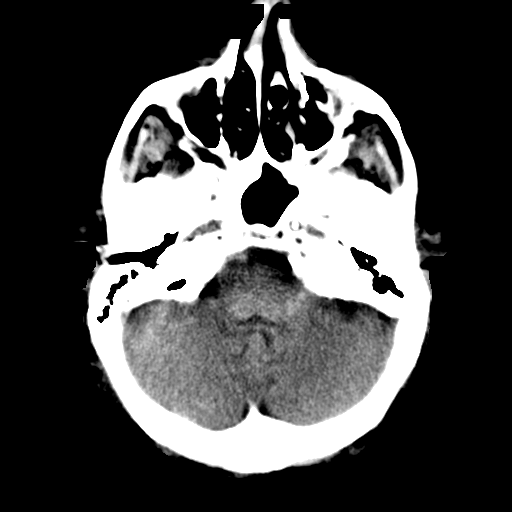
[im 10/31  brain]
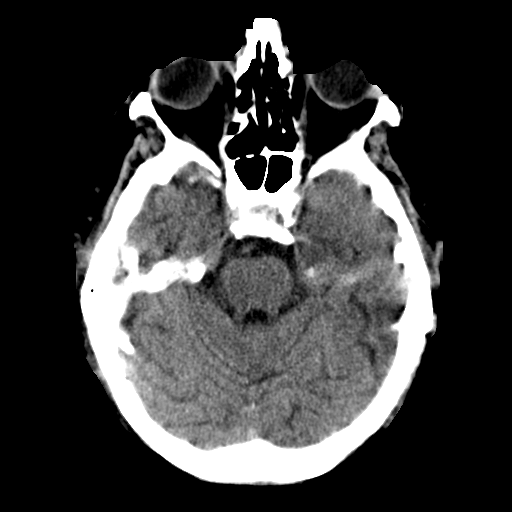
[im 13/31  brain]
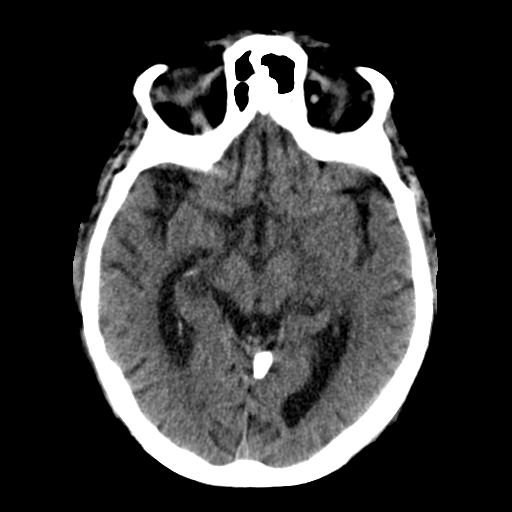
[im 16/31  brain]
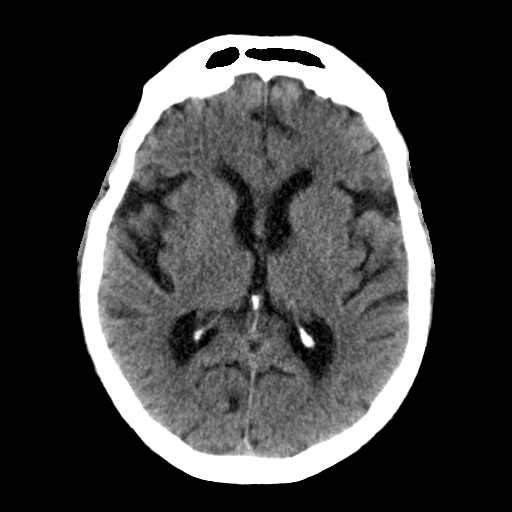
[im 16/31  bone]
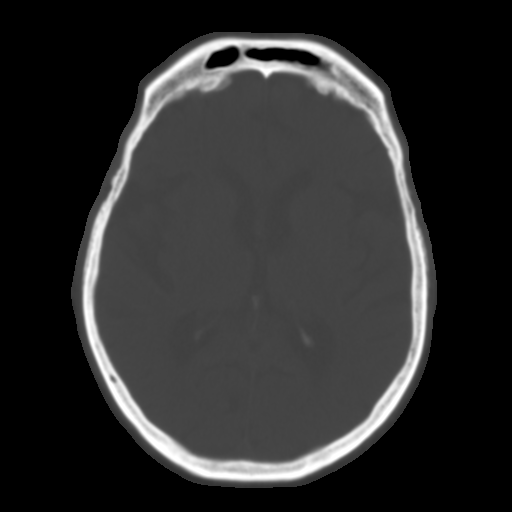
[im 19/31  brain]
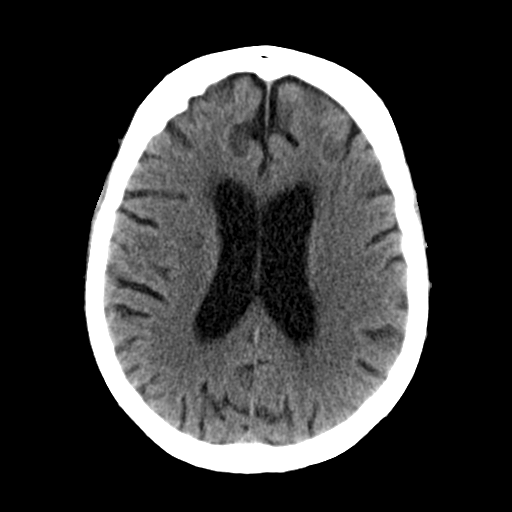
[im 22/31  brain]
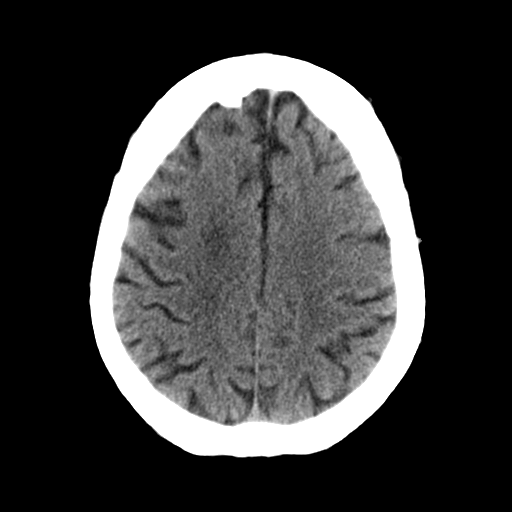
[im 25/31  brain]
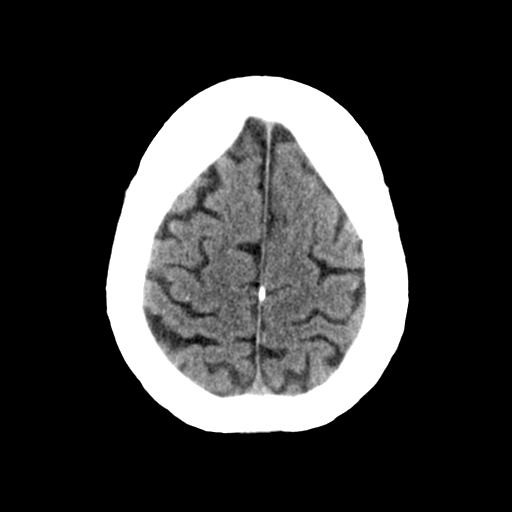
[im 28/31  brain]
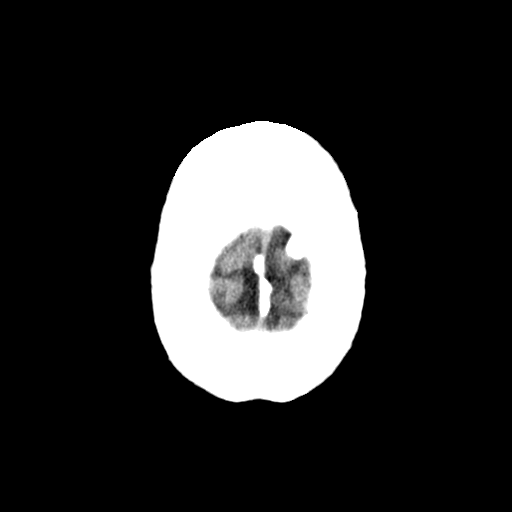
[im 28/31  bone]
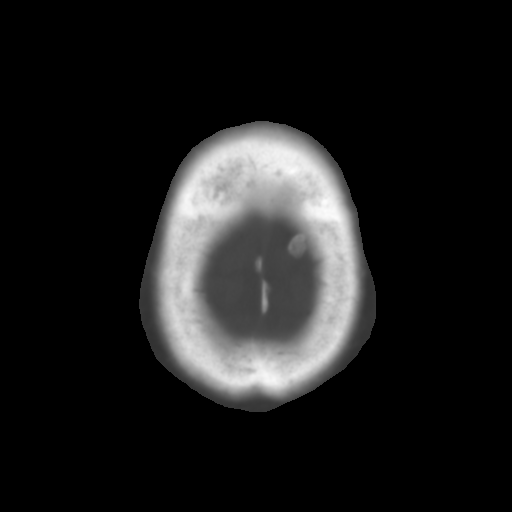

[Series 3: bone · axial · 0.39mm/px · z∈[+132,+238]mm · 7 of 31 slices shown]
[im 4/31  bone]
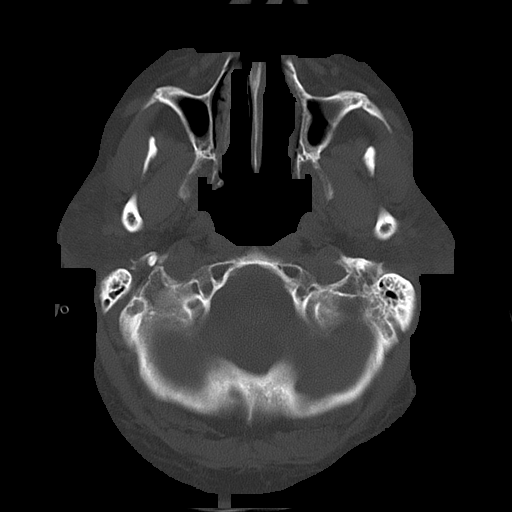
[im 7/31  bone]
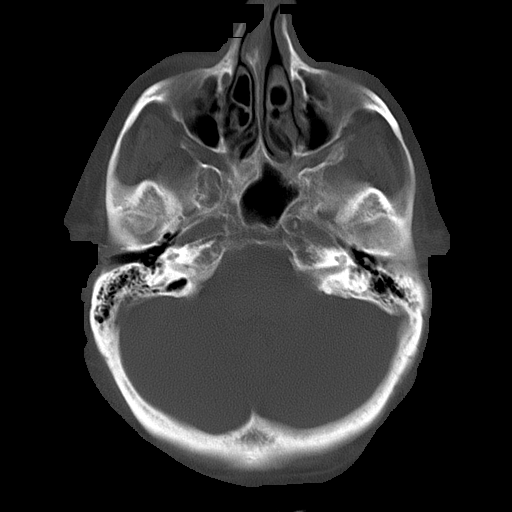
[im 10/31  bone]
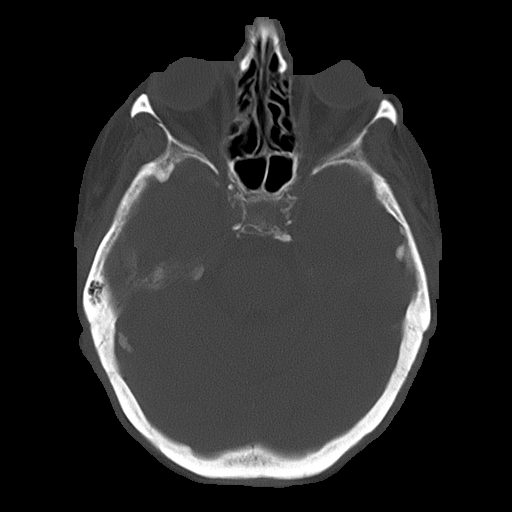
[im 13/31  bone]
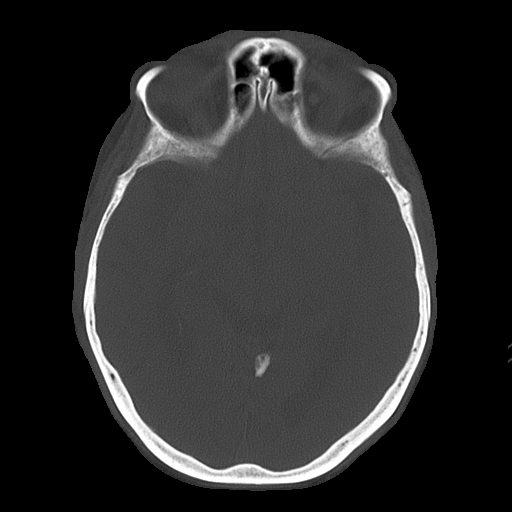
[im 19/31  bone]
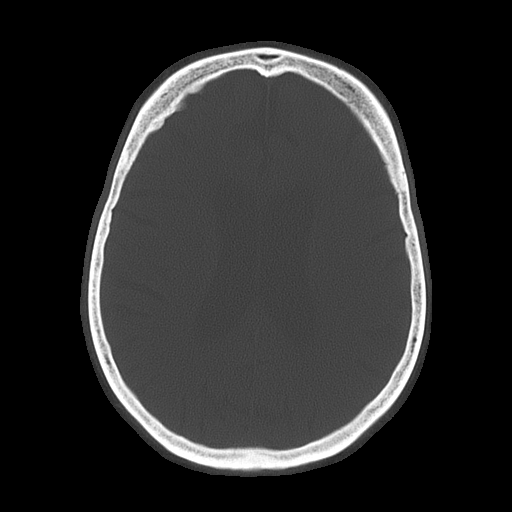
[im 22/31  bone]
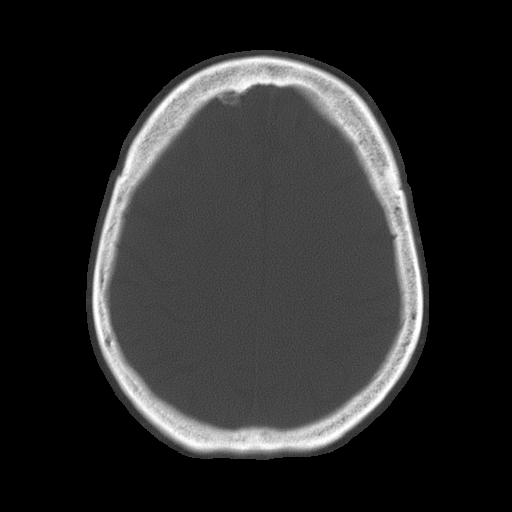
[im 25/31  bone]
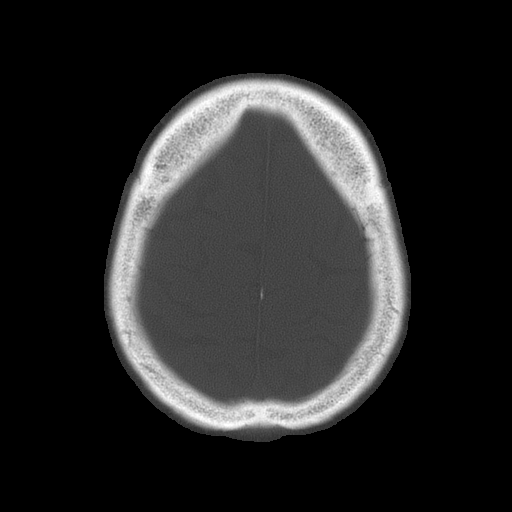

[16 of 30 positions shown; findings below may reference images not displayed]

FINDINGS: There is diffuse cortical atrophy as well as areas of low
attenuation within the subcortical, deep, and periventricular white matter
regions. The ventricles and cisterns are patent. There is no evidence of
mass effect. There is no evidence of intra-axial or extra-axial fluid
collections or evidence of acute hemorrhage. There is no evidence of a
depressed skull fracture. The paranasal sinuses are grossly unremarkable as
well as the mastoid air cells.
IMPRESSION: 1.     Involutional changes without evidence of focal or acute
abnormalities.
2.     If there is persistent clinical concern, further evaluation with MRI
is recommended.

## 2013-06-24 IMAGING — CR DG CHEST 2V
1 series · 2 of 2 positions shown · non-contrast
Comparison: none

REASON FOR EXAM: sob
COMMENTS:

[Series 1: w chest lat · 0.14mm/px · 2 of 2 slices shown]
[im 1/2]
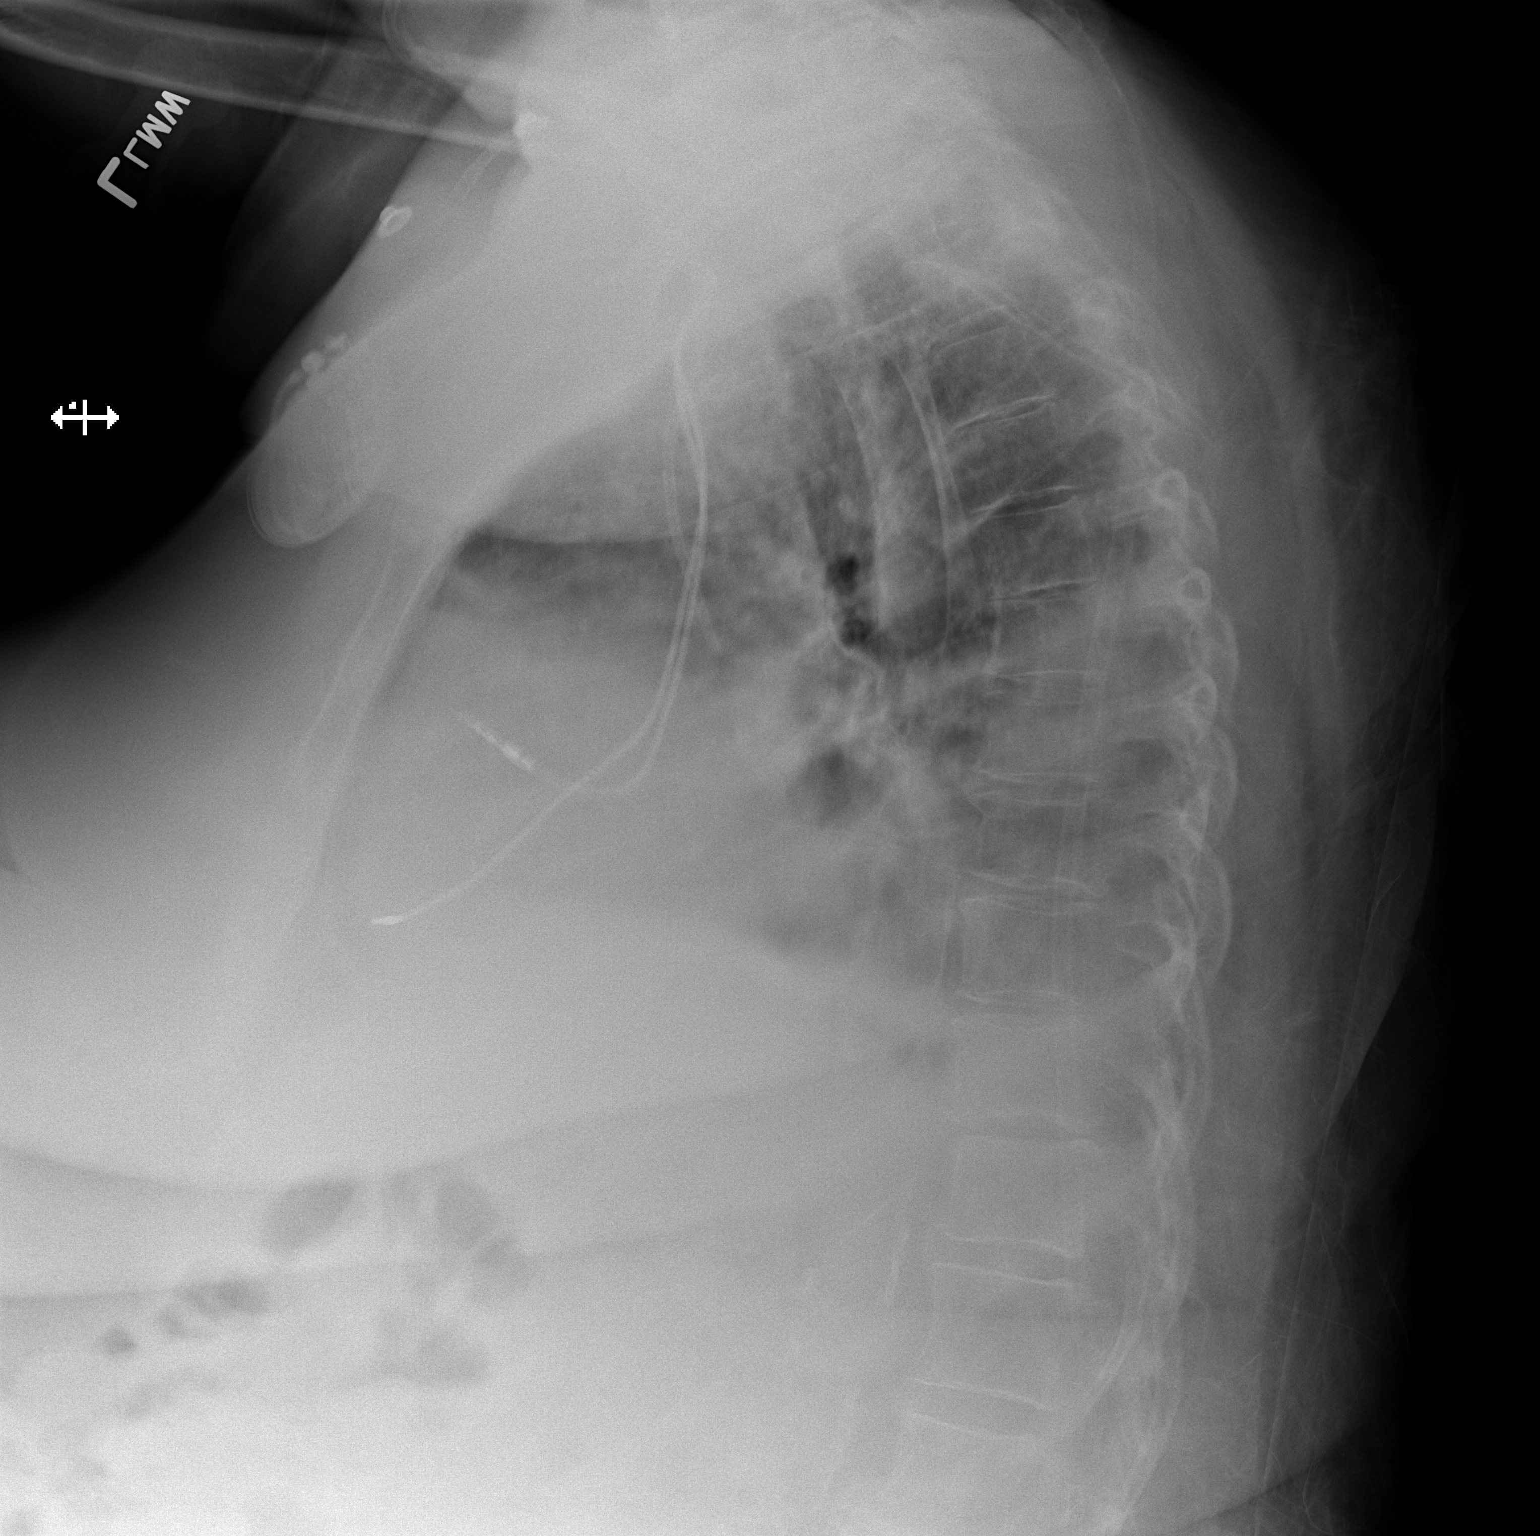
[im 2/2]
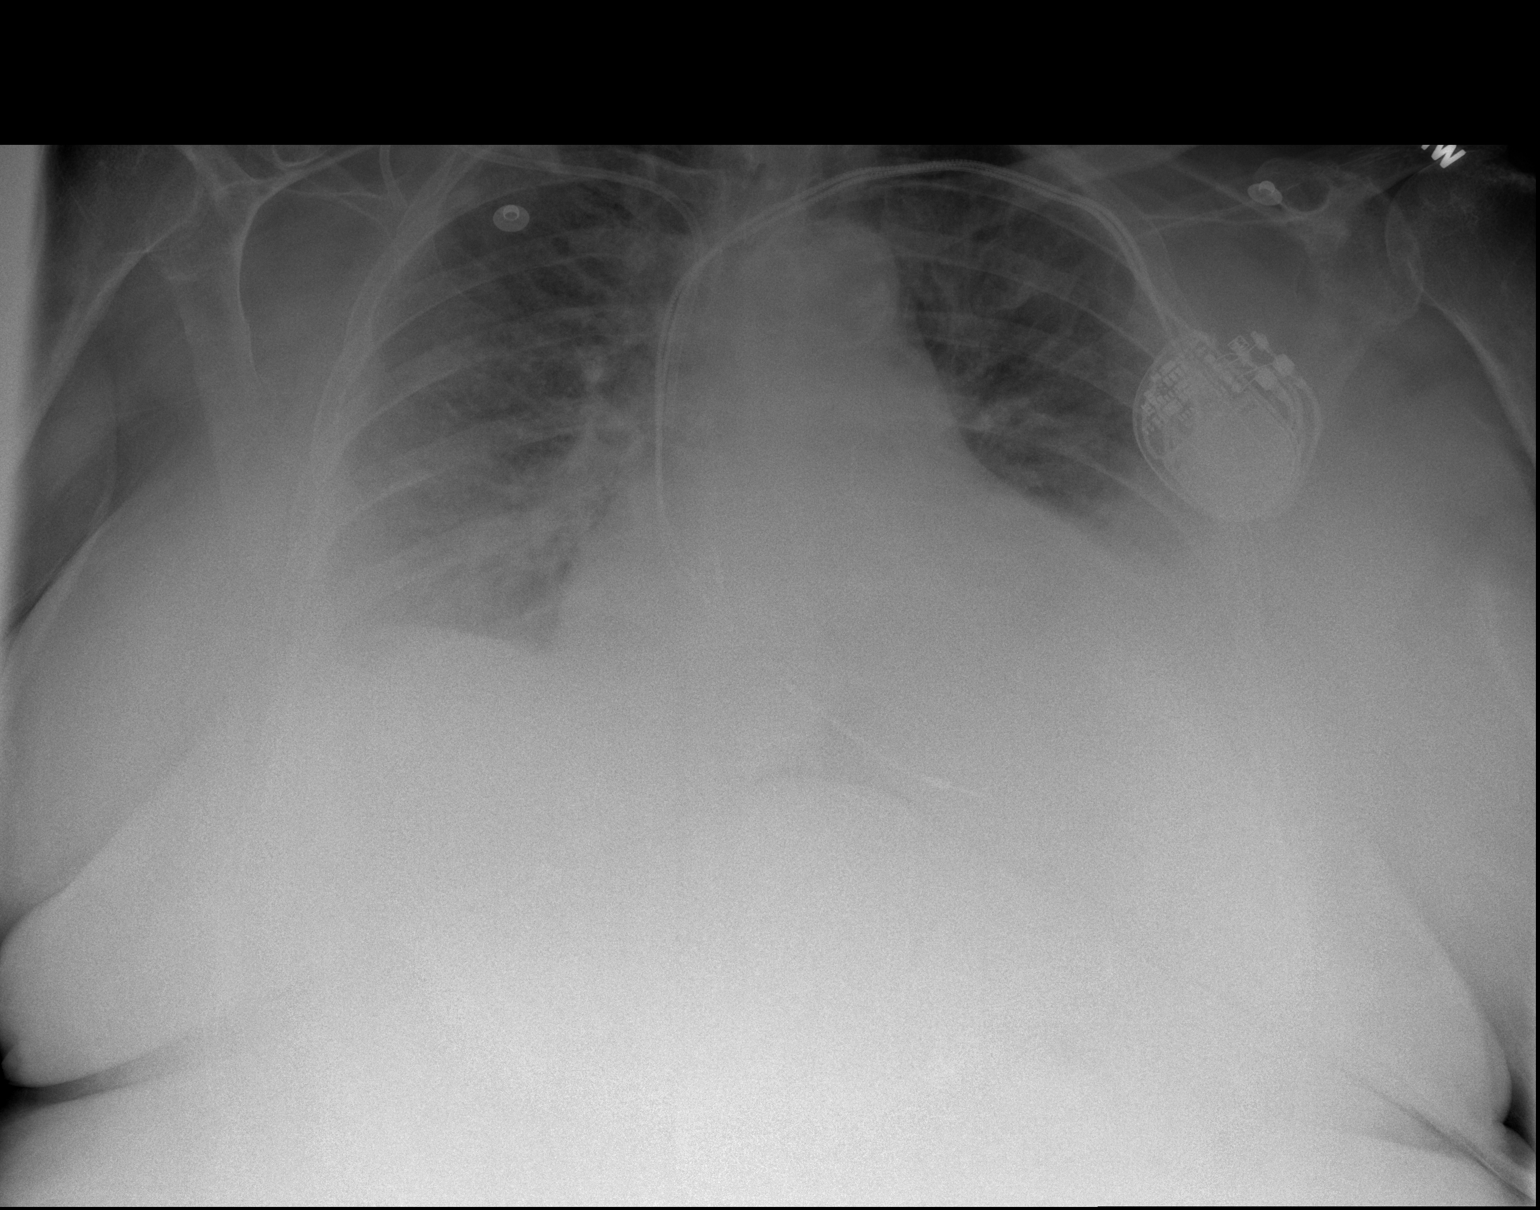

[2 of 2 positions shown; findings below may reference images not displayed]

PROCEDURE:     DXR - DXR CHEST PA (OR AP) AND LATERAL  - April 15, 2012 [DATE]

RESULT:     Comparison is made to the study 14 April, 2012.

Pleural effusions are present with cardiomegaly and diffuse interstitial
edema. Left-sided pacemaker device is present. There is a right-sided
subclavian catheter present with the tip in the superior vena cava.
IMPRESSION: Findings consistent with pulmonary edema and congestive
heart failure. Pacemaker present. Cardiomegaly demonstrated.

## 2014-03-17 ENCOUNTER — Inpatient Hospital Stay: Payer: Self-pay | Admitting: Internal Medicine

## 2014-03-17 LAB — TSH: Thyroid Stimulating Horm: 3.69 u[IU]/mL

## 2014-03-17 LAB — COMPREHENSIVE METABOLIC PANEL
Albumin: 3 g/dL — ABNORMAL LOW (ref 3.4–5.0)
Alkaline Phosphatase: 140 U/L — ABNORMAL HIGH
Anion Gap: 4 — ABNORMAL LOW (ref 7–16)
BILIRUBIN TOTAL: 0.6 mg/dL (ref 0.2–1.0)
BUN: 81 mg/dL — ABNORMAL HIGH (ref 7–18)
CHLORIDE: 118 mmol/L — AB (ref 98–107)
CREATININE: 3.34 mg/dL — AB (ref 0.60–1.30)
Calcium, Total: 9.2 mg/dL (ref 8.5–10.1)
Co2: 35 mmol/L — ABNORMAL HIGH (ref 21–32)
GFR CALC AF AMER: 14 — AB
GFR CALC NON AF AMER: 12 — AB
GLUCOSE: 201 mg/dL — AB (ref 65–99)
OSMOLALITY: 341 (ref 275–301)
POTASSIUM: 4.8 mmol/L (ref 3.5–5.1)
SGOT(AST): 80 U/L — ABNORMAL HIGH (ref 15–37)
SGPT (ALT): 76 U/L (ref 12–78)
SODIUM: 157 mmol/L — AB (ref 136–145)
Total Protein: 7 g/dL (ref 6.4–8.2)

## 2014-03-17 LAB — URINALYSIS, COMPLETE
BACTERIA: NONE SEEN
Bilirubin,UR: NEGATIVE
GLUCOSE, UR: NEGATIVE mg/dL (ref 0–75)
Hyaline Cast: 3
Ketone: NEGATIVE
Leukocyte Esterase: NEGATIVE
NITRITE: NEGATIVE
PROTEIN: NEGATIVE
Ph: 6 (ref 4.5–8.0)
RBC,UR: 6 /HPF (ref 0–5)
Specific Gravity: 1.014 (ref 1.003–1.030)
Squamous Epithelial: 1
WBC UR: 2 /HPF (ref 0–5)

## 2014-03-17 LAB — CBC
HCT: 47.1 % — ABNORMAL HIGH (ref 35.0–47.0)
HGB: 15 g/dL (ref 12.0–16.0)
MCH: 31 pg (ref 26.0–34.0)
MCHC: 31.9 g/dL — AB (ref 32.0–36.0)
MCV: 97 fL (ref 80–100)
Platelet: 262 10*3/uL (ref 150–440)
RBC: 4.84 10*6/uL (ref 3.80–5.20)
RDW: 16.2 % — AB (ref 11.5–14.5)
WBC: 9.1 10*3/uL (ref 3.6–11.0)

## 2014-03-17 LAB — PROTIME-INR
INR: 2.4
PROTHROMBIN TIME: 25.6 s — AB (ref 11.5–14.7)

## 2014-03-17 LAB — TROPONIN I: TROPONIN-I: 0.04 ng/mL

## 2014-03-17 LAB — SODIUM: Sodium: 156 mmol/L — ABNORMAL HIGH (ref 136–145)

## 2014-03-18 LAB — SODIUM
SODIUM: 156 mmol/L — AB (ref 136–145)
SODIUM: 155 mmol/L — AB (ref 136–145)
Sodium: 152 mmol/L — ABNORMAL HIGH (ref 136–145)
Sodium: 157 mmol/L — ABNORMAL HIGH (ref 136–145)
Sodium: 159 mmol/L — ABNORMAL HIGH (ref 136–145)

## 2014-03-18 LAB — CBC WITH DIFFERENTIAL/PLATELET
BASOS PCT: 0.5 %
Basophil #: 0 10*3/uL (ref 0.0–0.1)
Eosinophil #: 0.2 10*3/uL (ref 0.0–0.7)
Eosinophil %: 2.3 %
HCT: 42.1 % (ref 35.0–47.0)
HGB: 13.6 g/dL (ref 12.0–16.0)
LYMPHS ABS: 1.9 10*3/uL (ref 1.0–3.6)
Lymphocyte %: 28.9 %
MCH: 31.2 pg (ref 26.0–34.0)
MCHC: 32.4 g/dL (ref 32.0–36.0)
MCV: 96 fL (ref 80–100)
MONO ABS: 0.7 x10 3/mm (ref 0.2–0.9)
Monocyte %: 10 %
NEUTROS PCT: 58.3 %
Neutrophil #: 3.8 10*3/uL (ref 1.4–6.5)
PLATELETS: 235 10*3/uL (ref 150–440)
RBC: 4.37 10*6/uL (ref 3.80–5.20)
RDW: 15.5 % — ABNORMAL HIGH (ref 11.5–14.5)
WBC: 6.5 10*3/uL (ref 3.6–11.0)

## 2014-03-18 LAB — BASIC METABOLIC PANEL
ANION GAP: 3 — AB (ref 7–16)
BUN: 64 mg/dL — AB (ref 7–18)
CO2: 32 mmol/L (ref 21–32)
Calcium, Total: 8.7 mg/dL (ref 8.5–10.1)
Chloride: 122 mmol/L — ABNORMAL HIGH (ref 98–107)
Creatinine: 2.77 mg/dL — ABNORMAL HIGH (ref 0.60–1.30)
EGFR (African American): 18 — ABNORMAL LOW
EGFR (Non-African Amer.): 16 — ABNORMAL LOW
Glucose: 143 mg/dL — ABNORMAL HIGH (ref 65–99)
OSMOLALITY: 332 (ref 275–301)
POTASSIUM: 3.5 mmol/L (ref 3.5–5.1)
SODIUM: 157 mmol/L — AB (ref 136–145)

## 2014-03-19 ENCOUNTER — Ambulatory Visit: Payer: Self-pay | Admitting: Internal Medicine

## 2014-03-19 LAB — SODIUM
SODIUM: 145 mmol/L (ref 136–145)
SODIUM: 145 mmol/L (ref 136–145)
SODIUM: 145 mmol/L (ref 136–145)
SODIUM: 151 mmol/L — AB (ref 136–145)
Sodium: 148 mmol/L — ABNORMAL HIGH (ref 136–145)

## 2014-03-28 ENCOUNTER — Ambulatory Visit: Payer: Self-pay | Admitting: Internal Medicine

## 2014-07-28 DEATH — deceased

## 2015-04-16 NOTE — Op Note (Signed)
PATIENT NAME:  Dawn Johns, Dawn L MR#:  [id number] DATE OF BIRTH:  23-May-1933  DATE OF PROCEDURE:  09/07/2012  PREOPERATIVE DIAGNOSES:  1. Nonclearing vitreous hemorrhage, left eye.  2. Proliferative diabetic retinopathy, left eye.   POSTOPERATIVE DIAGNOSES:  1. Nonclearing vitreous hemorrhage, left eye.  2. Proliferative diabetic retinopathy, left eye.   PROCEDURES PERFORMED:  1. Pars plana vitrectomy of the left eye.  2. Endolaser of the left eye.   PRIMARY SURGEON: Aron BabaMatthew Kaidyn Hernandes, MD  ESTIMATED BLOOD LOSS: Less than 1 mL.  ANESTHESIA: General endotracheal anesthesia with a supplemental retrobulbar block of the left eye.   COMPLICATIONS: None.   INDICATIONS FOR PROCEDURE: This is a patient who presented to my office with decreasing vision in the left eye. Examination revealed a vitreous hemorrhage. An extensive amount of time was provided for the patient to clear. After multiple weeks new blood was identified and non-clearance was confirmed. Risks, benefits, and alternatives of the above procedure were discussed and the patient wished to proceed.   DETAILS OF PROCEDURE: After informed consent was obtained, the patient was brought into the operative suite at [facility]. The patient was induced by the anesthesia team without complication. The patient was then positioned. The left eye was then prepped and draped in sterile manner. After lid speculum was inserted, a small transconjunctival wound was created inferonasally and a retrobulbar block was created in a sub-tenon fashion. A 25-gauge trocar was then placed inferotemporally through displaced conjunctiva in an oblique fashion 3 mm beyond the limbus in the inferotemporal quadrant. The infusion cannula was turned on and inserted through the trocar and secured into position with Steri-Strips. Two more trocars were placed in a similar fashion superotemporally and superonasally. The vitreous cutter and light pipe were  introduced in the eye and a core vitrectomy was performed. After the peripheral vitreous was trimmed for 360 degrees, thus isolating the posterior from the anterior vitreous end, the posterior vitreous was elevated off of the macula. The neovascularization of the disk was then amputated easily using suction. The remnant vitreous was removed. Suction was used to remove all of the blood on the posterior pole. Endolaser was introduced and a supplemental panretinal photocoagulation was performed for 360 degrees. No signs of any further bleeding could be identified. A partial air-fluid exchange was performed and the trocars were removed. The wounds were noted to be airtight and pressure in the eye was confirmed to be approximately 15 mmHg.  5 mg of dexamethasone was given into the inferior fornix and the lid speculum was removed. The eye was cleaned and TobraDex was placed on the eye. A patch and shield were placed over the eye and the patient was reversed by the anesthesia team. The patient was taken to postanesthesia care with instructions to remain head up.  ____________________________ Dawn FellingMatthew F. Champ MungoAppenzeller, MD mfa:slb D: 09/07/2012 09:07:37 ET T: 09/07/2012 09:44:43 ET JOB#: [id number]  cc: Dawn FellingMatthew F. Champ MungoAppenzeller, MD, <Dictator> Cline CoolsMATTHEW F Parneet Glantz MD ELECTRONICALLY SIGNED 09/13/2012 6:52

## 2015-04-20 NOTE — Consult Note (Signed)
PATIENT NAME:  Dawn Johns, Dawn Johns DATE OF BIRTH:  March 14, 1933  DATE OF CONSULTATION:  03/18/2014  CONSULTING PHYSICIAN:  Young Mulvey Lizabeth LeydenN. Nahiem Dredge, MD  REQUESTING PHYSICIAN:  Dr. Deanna ArtisAruna Gouru  REASON FOR CONSULTATION: Acute renal failure.   HISTORY OF PRESENT ILLNESS: The patient is a [age] year old Caucasian female with past medical history of progressive dementia, hyperlipidemia, hypothyroidism, chronic back pain, depression, hypertension, and osteoarthritis, who was [facility] from the nursing home with altered mental status. The patient is unable to provide any history at this point in time. Upon presentation, the patient was found to have a creatinine of 3.34, along with a serum sodium of 157. Her baseline serum sodium is 141, with a creatinine of 1.1. However, these labs were from October 2013. She was admitted to the medical floor, and was started on IV fluid hydration with D5W at 100 mL/hour. Currently serum sodium is down to 155. The patient is not responding to questions posed, and is quite lethargic at present.   PAST MEDICAL HISTORY: 1.  Progressive dementia.  2.  Hypertension.  3.  Hyperlipidemia.  4.  Hypothyroidism.  5.  Peripheral neuropathy.  6.  Chronic back pain.  7.  Depression.  8.  Bipolar disorder.   ALLERGIES: ATARAX, BACITRACIN, CODEINE, HYDROMORPHONE, MORPHINE, NEOMYCIN, and TYLENOL.   CURRENT INPATIENT MEDICATIONS INCLUDE: 1.  D5W at 100 mL/hour.  2.  Heparin 5000 units subcu q. 12 hours.  3.  Sliding scale insulin.  4.  Levothyroxine 37.5 mcg IV daily.  5.  Zofran 4 mg IV q. 4 hours p.r.n.  6.  Roxicodone solution 0.25 mg p.o. q. 2 hours p.r.n. pain.   SOCIAL HISTORY: Unable to obtain from the patient at present. However, per dictated history and physical, it appears that she is a resident of [facility].   FAMILY HISTORY: Unable to obtain from the patient, given her dementia.   REVIEW OF SYSTEMS: Unable to obtain  from the patient, given her underlying dementia.   PHYSICAL EXAMINATION: VITAL SIGNS: Temperature 97.9, pulse 74, respirations 16, blood pressure 149/84.  GENERAL: Reveals an elderly female who is resting in bed in no acute distress.  HEENT: Normocephalic, atraumatic. Spontaneous extraocular movements were noted. Pupils were 4 mm bilaterally and reactive. It was difficult to assess hearing. Oral mucosa are quite dry.  NECK: Supple, without JVD or lymphadenopathy.  LUNGS: Clear to auscultation bilaterally, with normal respiratory effort.  CARDIOVASCULAR: S1, S2. Regular rate and rhythm. No murmurs, rubs or gallops appreciated.  ABDOMEN: Soft, nontender, nondistended. Bowel sounds positive. No rebound or guarding. No gross organomegaly appreciated.  EXTREMITIES: No clubbing, cyanosis, or edema.  NEUROLOGIC: The patient was resting comfortably in bed. She was not following any commands.  GENITOURINARY: No suprapubic tenderness elicited.  SKIN: Warm and dry. Decreased skin turgor noted. No rashes.  MUSCULOSKELETAL: No joint redness, swelling or tenderness appreciated.  PSYCHIATRIC: Unable to assess at this time.   LABORATORY DATA: Upon presentation, serum sodium was 157, potassium 4.8, chloride 118, CO2 of 35, BUN 81, creatinine 3.34, glucose 201. Currently, serum sodium is 155. CBC shows WBC 6.5, hemoglobin 13.6, hematocrit 42, platelets 235. Urinalysis showed 6 RBCs per high-power field, 2 WBCs per high-power field, no bacteria. ABG showed pH of 7.48, pCO2 of 46, pO2 of 65. CT head without contrast demonstrating no definite intracranial abnormalities. Chest x-ray showed no acute cardiopulmonary abnormalities.   IMPRESSION: This is a [age] year old Caucasian female with past medical history of  progressive dementia, hypertension, hyperlipidemia, hypothyroidism, chronic back pain, depression, bipolar disorder, and osteoarthritis, who presented to [facility] with altered mental status  and was found to have multiple metabolic derangements, including acute renal failure as well as hypernatremia.   PROBLEM LIST: 1.  Hypernatremia.  2.  Acute renal failure.  3.  Progressive dementia.  4.  Hypertension.   PLAN: The patient has underlying progressive dementia. It was mentioned in the History and Physical dictated by the hospitalist that she has had rather progressive decline over the past several weeks. She likely had decreased access to water, and as a result likely had decreased p.o. water intake. We agree with IV fluid hydration with D5W. We will adjust the rate slightly and increase it to 125 mL per hour. In regards to the acute renal failure, we suspect that she has had prolonged dehydration, which has now resulted in acute renal failure. Continue hydration as above, but we will also check a renal ultrasound to make sure there is no underlying obstruction. Agree with holding Lasix at this time, as well as metoprolol. Overall, however, the patient appears to have progressive dementia, and we agree with obtaining a Palliative Care consultation. Further plan as patient progresses.     ____________________________ Lennox Pippins, MD mnl:mr D: 03/18/2014 17:20:00 ET T: 03/18/2014 18:16:59 ET JOB#: [id number]  cc: Lennox Pippins, MD, <Dictator> Ria Comment Thomasina Housley MD ELECTRONICALLY SIGNED 03/27/2014 8:55

## 2015-04-20 NOTE — H&P (Signed)
PATIENT NAME:  LEIGH, BLAS MR#:  [id number] DATE OF BIRTH:  1933/10/12  DATE OF ADMISSION:  03/17/2014  PRIMARY CARE PHYSICIAN: Dr. Lavera Guise  CHIEF COMPLAINT: Encephalopathy.  HISTORY OF PRESENT ILLNESS:  A 81 year old female with a history of dementia, diabetes, hypothyroidism, insomnia, who presents from her nursing home with the above issues. The patient was brought in for lethargy and altered mental status. She is not responsive at all. Her sodium level is 157. As per the family, the patient has had progressive dementia, not been doing well over the past several weeks. She is bed-bound, not been eating very well, and her dementia sounds like it is end-stage.   REVIEW OF SYSTEMS:  Unable to obtain, as the patient is lethargic, not very responsive.   PAST MEDICAL HISTORY:  1.  Dementia.  2.  Hyperlipidemia.  3.  Hypothyroidism.  4.  Neuropathy. 5.  Chronic back pain.  6.  Depression with anxiety. 7.  Hypertension.  8.  Bipolar.  9.  Osteoarthritis.   MEDICATIONS: 1.  Haldol 50 mg IM every 4 weeks for psychosis.  2.  Synthroid 75 mcg daily.  3.  Claritin 10 mg daily.  4.  Apidra 8 units in the morning.  5.  MiraLAX powder 17 grams daily.  6.  Lantus 46 units at bedtime. 7.  Lamictal 200 mg b.i.d.  8.  Alphagan eyedrops 1 drop to each eye b.i.d.  9.  Haldol 2 mg 1 tablet by mouth twice daily.  10.  Metoprolol 25 mg b.i.d.  11.  Lasix 40 mg b.i.d. 12.  Apidra 10 units at lunch.  13.  Ativan 1 mg t.i.d.  14.  Coumadin 5 mg every other day, alternating with 4 mg tablet. 15.  Diabetic Med Pass 3 ounces t.i.d.  16.  Lantus 18 units every evening for diabetes.  17.  Lipitor 10 mg daily.  18.  Oxygen 2 liters nasal cannula at bedtime.   ALLERGIES: HYDROMORPHONE, BACITRACIN, TYLENOL, MORPHINE, ATARAX, CODEINE, NEOMYCIN.   SURGICAL HISTORY:  1.  Hysterectomy.  2.  Pacemaker, as per the medical chart.   SOCIAL HISTORY: The patient is a resident at [place]. No  other social history is elicited.  FAMILY HISTORY: Unknown at this time.   PHYSICAL EXAMINATION: VITAL SIGNS: Temperature 97.3, pulse 79, respirations 14, blood pressure 176/98, 99% on 2 liters.  GENERAL: The patient is not responsive, lethargic, not responding to commands. She kind of moans. She has intermittent apnea.  HEENT: Head is atraumatic. Pupils are 4 mm. They are very sluggish to light. Oropharynx is clear. She has a very dry tongue, thick white on her tongue.  NECK: Supple, short. Hard to appreciate any thyromegaly due to her short neck.  LUNGS: Clear to auscultation without crackles, rales, rhonchi or wheezing. Not using accessory muscles at this time. She does appear apneic at times.  CARDIOVASCULAR: Regular rate and rhythm. No murmurs, gallops, rubs. Hard to appreciate PMI due to body habitus.  ABDOMEN: Bowel sounds are present. Nontender, nondistended. No hepatosplenomegaly. No rebound or guarding.  EXTREMITIES: No clubbing or cyanosis. NEUROLOGIC: Hard to do a neuro exam, as the patient is very lethargic, unresponsive, very somnolent.  MUSCULOSKELETAL: Hard to do a musculoskeletal exam due to her current mental status. Apparently she is bedbound.   LABORATORIES: White blood cells 9.1, hemoglobin 15, hematocrit 47.1, platelets 262. Sodium 157, potassium 4.8, chloride 118, bicarb 35, BUN 81, creatinine 3.34, glucose 201. Bilirubin 0.6, calcium 9.2, alk phos 140,  ALT 76, AST 87,protein 7.0  albumin 3.0. Troponin 0.04.   Chest x-ray shows no acute cardiopulmonary disease. CT of the head shows no intracranial abnormalities. EKG is pending at this time.  ASSESSMENT AND PLAN: A 81 year old female with dementia, who is from [place], who presents with unresponsiveness, encephalopathy, and hypernatremia.   1.  Metabolic encephalopathy due to hypernatremia and dementia. Will treat the hypernatremia, and follow her neurological status q. 4 hours. Family is well-aware of her  illness. They do not want aggressive or heroic measures. They do want to try to decrease her sodium level and see if her encephalopathy improves. If there is no improvement within the next 2 days, they may want palliative care and hospice care.   2.  Hypernatremia. Suspect this is from dementia, poor p.o. intake, and severity of her dementia. I did discuss that this is probably going to be a cyclic issue due to her progressive dementia. The family is well-aware of this. will try D5 and monitor her sodium levels. Next sodium level is in approximately 4 hours.   3.  History of irregular heartbeat. It appears that this is possibly atrial fibrillation. The patient is on Coumadin. The ER physician did not order a PT/INR, so I will go head and order that stat. For now, due to patient's unresponsiveness, I am holding all p.o. medications.   4.  Acute renal failure, likely secondary to poor p.o. intake, dehydration. Will provide the fluids as mentioned above for the hypernatremia, repeat a BMP in the morning. Hold any nephrotoxic agents at this time. Monitor I's and O's.   5.  Diabetes. The patient is n.p.o. Will provide sliding scale insulin. Hold any medications for now.   6.  Hypothyroidism. Will check a TSH, provide Synthroid IV. The IV form will be half of her p.o. dose.   The patient is critically ill. Family is well-aware of her critical illness. Will consult Palliative Care on Monday. Will monitor patient closely.   Critical care time spent approximately 55 minutes.     ____________________________ Donell Beers. Benjie Karvonen, MD spm:mr D: 03/17/2014 16:43:58 ET T: 03/17/2014 18:04:21 ET JOB#: [id number]  cc: Elmo Shumard P. Benjie Karvonen, MD, <Dictator> Donell Beers Ceci Taliaferro MD ELECTRONICALLY SIGNED 03/17/2014 19:09

## 2015-04-20 NOTE — Discharge Summary (Signed)
PATIENT NAME:  Dawn Johns, Dawn Johns MR#:  [id number] DATE OF BIRTH:  Sep 20, 1933  DATE OF ADMISSION:  03/17/2014 DATE OF DISCHARGE:  03/19/2014  DISCHARGE DIAGNOSES: 1.  Acute metabolic encephalopathy due to hypernatremia and dementia, likely due to poor p.o. intake, now resolved, remains at high risk for same. 2.  Hypernatremia, now resolved.  3.  History of irregular heartbeat, possibility of fibrillation, not a good candidate for anticoagulation. Now will be followed by hospice.  4.  Acute renal failure, likely secondary to poor p.o. intake and dehydration, likely prerenal etiology, now resolved.   SECONDARY DIAGNOSES: 1.  Dementia.  2.  Hyperlipidemia.  3.  Hypothyroidism.  4.  Neuropathy.  5.  Chronic back pain.  6.  Depression and anxiety.  7.  Hypertension.  8.  Bipolar disorder.  9.  Osteoarthritis.   CONSULTATIONS:  1.  Speech therapy.  2.  Palliative care.   3.  Nephrology, Dr. Mady Haagensen.   PROCEDURES AND RADIOLOGY:  1.  Chest x-ray on the 21st of March showed no acute cardiopulmonary disease.  2.  CT scan of the head without contrast on the 21st of March showed no acute intracranial abnormality.  3.  Bilateral kidney ultrasound on the 22nd of March showed mild renal atrophy without significant abnormality.  4.  Major laboratory panel: UA on admission was negative.   HISTORY AND SHORT HOSPITAL COURSE: The patient is a 81 year old female with the above-mentioned medical problems who was admitted for encephalopathy thought to be secondary to severe hypernatremia. Please see Dr. Camillia Herter dictated history and physical for further details. The patient was also noted to have irregular heartbeat concerning for possible atrial fibrillation but considering her advanced age and poor comorbid condition, she was thought to be not a candidate for anticoagulation. Her heart rate was under better control. Nephrology consultation was obtained with Dr. Cherylann Ratel for acute renal failure, who  recommended continuing hydration which also helped her hypernatremia and this has been resolved and she was feeling much better. Her sodium has normalized. She was also much more alert today. Palliative care consultation was obtained with Dr. Harvie Junior, who had discussion with the patient's son and decision was made to make her hospice care at [facility]. The patient and son are in agreement and she is being discharged back to [facility] in fair condition.   PHYSICAL EXAMINATION: VITAL SIGNS: On the date of discharge, her vital signs are as follows: Temperature 97.8, heart rate 80 per minute, respirations 20 per minute, blood pressure 125/67. She is saturating 95% on room air.  CARDIOVASCULAR: Irregularly irregular heart sounds. No murmurs, rubs or gallop.  LUNGS: Clear to auscultation bilaterally. No wheezing, rales, rhonchi or crepitation.  ABDOMEN: Soft, benign.  NEUROLOGIC: Nonfocal examination. She was much more alert but not oriented.  All other physical examination remained at baseline.   DISCHARGE MEDICATIONS: 1.  Synthroid 75 mcg p.o. daily.  2.  Alphagan 0.15% ophthalmic drop to each eye twice a day.  3.  Metoprolol 25 mg p.o. b.i.d.  4.   Apidra as per sliding scale as directed.  5.  Haldol 0.5 mg intramuscular every 4 weeks for psychosis.  6.  Loratadine 10 mg p.o. daily.  7.  MiraLax once daily.  8.  Insulin Lantus 46 units every morning and 18 units subcutaneous every evening.  9.  Lamotrigine 200 mg p.o. b.i.d.  10.  Lasix 40 mg p.o. b.i.d.  11.  Diabetic  medpass 3 ounces orally 3 times a day.  11.  Atorvastatin 10 mg p.o. at bedtime.  12.  Oxygen 2 liters via nasal cannula continuous at bedtime.  13.  Haldol 2 mg p.o. b.i.d. as needed.  14.  Lorazepam 1 mg p.o. b.i.d. as needed.  15.  Oxycodone 5 mg p.o. every 8 hours as needed.  16.  Insulin Apidra 8 units subcu every morning before breakfast, 10 units with lunch and 14 units every evening before supper for diabetes.    DISCHARGE DIET: Regular.   DISCHARGE ACTIVITY: As tolerated.   DISCHARGE INSTRUCTIONS AND FOLLOWUP: The patient was instructed to follow up with her primary care physician, Dr. Joen LauraLaura Bliss, in 1 to 2 weeks. She will be [facility].   TOTAL TIME DISCHARGING THIS PATIENT: 55 minutes.     ____________________________ Ellamae SiaVipul S. Sherryll BurgerShah, MD vss:cs D: 03/19/2014 18:10:35 ET T: 03/19/2014 19:08:18 ET JOB#: [id number]  cc: Wilma Wuthrich S. Sherryll BurgerShah, MD, <Dictator> Burley SaverL. Katherine Bliss, MD Munsoor Lizabeth LeydenN. Lateef, MD Ned GraceNancy Phifer, MD Ellamae SiaVIPUL S [facility] MD ELECTRONICALLY SIGNED 03/23/2014 19:21

## 2015-04-21 NOTE — Discharge Summary (Signed)
PATIENT NAME:  Dawn Johns, [name] MR#:  [id number] DATE OF BIRTH:  [birth year]  DATE OF ADMISSION:  04/15/2012 DATE OF DISCHARGE:  04/19/2012  PRIMARY CARE PHYSICIAN: [facility]   REASON FOR ADMISSION: Altered mental status/lethargy.   DISCHARGE DIAGNOSES:  1. Altered mental status/lethargy which is multifactorial from metabolic encephalopathy from hypoglycemia plus medication induced, polypharmacy.  2. Polypharmacy.  3. History of diabetes mellitus with hypoglycemia on admission and later noted to be quite hyperglycemic with hemoglobin A1c of 7.8.  4. Hyperkalemia, resolved.  5. Chronic systolic and diastolic congestive heart failure, ejection fraction 35% to 45%.  6. Chronic anemia.  7. Renal insufficiency, possible chronic kidney disease Baseline creatinine 1.3 to 1.6 in our system.  8. Possible urinary tract infection.  9. Paroxysmal atrial fibrillation status post permanent pacemaker insertion. 10. History of hypertension.  11. Chronic vertigo.  12. History of left bundle branch block on electrocardiogram. 13. History of hypothyroidism. 14. History of coronary artery disease.  15. History of early dementia.  16. History of depression.  17. History of confusion in the past. 18. Glaucoma.  19. History of lymphoma which is currently presumably in remission.  20. History of right popliteal deep venous thrombosis.  21. DO NOT RESUSCITATE.   CONSULT: Palliative Care, Dr. Harvie JuniorPhifer.   DISCHARGE DISPOSITION: [facility].   DISCHARGE MEDICATIONS:  1. Oxygen at 2 liters per minute via nasal cannula at bedtime.  2. Omnicef 300 mg p.o. every 12 hours x3 days. 3. Lasix 20 mg p.o. daily.  4. Metoprolol 25 mg p.o. b.i.d.  5. Lantus 30 units subcutaneously at bedtime. 6. NovoLog sliding scale insulin q.a.c. and at bedtime, see prescription for sliding scale insulin. 7. Trazodone 50 mg p.o. at bedtime.  8. Risperdal 1 mg p.o. b.i.d.  9. Lipitor  20 mg p.o. daily.  10. Synthroid 75 mcg p.o. daily. 11. Coumadin 3.5 mg p.o. q.24 hours.  12. Roxicodone 5 mg p.o. q.8 hours p.r.n. pain.  13. Xanax 0.25 mg p.o. q.8 hours p.r.n. anxiety.  14. Mirapex 0.25 mg p.o. b.i.d. prn(at 9:00 a.m. and 8:00 p.m.).   DISCHARGE CONDITION: Improved, stable.   DISCHARGE ACTIVITY: As tolerated.   DISCHARGE DIET: Low sodium, ADA, low fat, low cholesterol.   DISCHARGE INSTRUCTIONS:  1. Take medications as prescribed.  2. Return to Emergency Department for recurrence of symptoms.  FOLLOW UP INSTRUCTIONS: Follow up with house M.D. at skilled nursing facility within one week. Patient needs repeat PT/INR in two to three days. Repeat BMP and CBC within one week. Follow up with your cardiologist within 2 to 3 weeks.   LABORATORY, DIAGNOSTIC AND RADIOLOGICAL DATA:  Portable chest x-ray 04/14/2012: Basal atelectasis versus infiltrate.  Chest x-ray PA and lateral 04/15/2012: Findings consistent with pulmonary edema and congestive heart failure.   Noncontrast head CT 04/15/2012: Involutional changes without evidence of focal or acute abnormalities.  Right lower extremity venous Doppler ultrasound limited exam. No occlusive thrombus in the deep venous system in the right leg. Venous system left leg was not interrogated at patient's request.   Portable chest x-ray 04/17/2012: Decrease interstitial findings likely representing improved edema.   2-D echocardiogram 04/15/2012: LV systolic function moderately reduced, ejection fraction 35% to 45%. Transmitral spectral Doppler flow pattern suggestive of impaired LV relaxation. Moderate concentric left ventricular hypertrophy, moderate anterior wall hypokinesis, moderate to severe septal hypokinesis, moderate apical wall hypokinesis. RV systolic function normal. Left atrial size normal. Mild to moderate MR. RV systolic pressure is elevated at  30 to 40 mmHg.    BUN 17, creatinine 1.32 on admission with BUN 13, creatinine  1.16 at the time of discharge. Glucose 40 upon arrival to the ER.   Hemoglobin A1c 7.8.   Hemoglobin 8.9, hematocrit 27.9 on admission. Hemoglobin 9.7, hematocrit 30.7 04/18/2012. TSH normal at 4.09. INR is 3.2 on admission, 3.1 on the day of discharge.   Urinalysis on admission cloudy urine with 3 WBCs, trace leukocyte esterase, negative nitrite, trace bacteria. Urine culture from 04/15/2012: No growth to date.   Stool for occult blood was negative 04/15/2012.  Serum potassium 5.6 from 04/16/2012, normal at 4 on 04/18/2012.   BRIEF HISTORY/HOSPITAL COURSE: Patient is a 81 year old female with extensive past medical history as listed above including hypertension, diabetes mellitus, left bundle branch block, atrial fibrillation on Coumadin status post permanent pacemaker insertion, coronary artery disease, chronic respiratory failure due to congestive heart failure with ejection fraction 35, patient is on chronic nocturnal oxygen, chronic vertigo who was sent from skilled nursing facility due to altered mental status, confusion with lethargy and was noted to be hypoglycemic on admission with sugars in the 40s. Please see dictated admission history and physical for pertinent details surrounding the onset of this hospitalization and please see below for further details.  1. Altered mental status-Felt to be multifactorial from metabolic encephalopathy caused by hypoglycemia in addition to polypharmacy as she was on multiple sedating medications and looking at her admission medication list she was on at approximately 28 medications at the time of admission with two scheduled benzodiazepines and one scheduled narcotic and multiple sedating medications likely decreasing her ability to function and making her quite lethargic and not awake enough to eat leading to dehydration and acute renal failure as a result and while she was not eating and concomitantly receiving insulin this led to some hypoglycemia and  also medications hanging around longer in her system due to some renal insufficiency all leading to altered mental status, lethargy, and confusion. Noncontrast head CT was negative for acute intracranial abnormalities at the time of admission. She was initially started on D5 infusion. Scheduled benzodiazepines and narcotics were held. With all of these measures her mental status has returned to baseline with resolution of her encephalopathy thereafter. She was seen in consultation by palliative care and Dr. Harvie Junior also agreed that patient was being overmedicated and recommended changing her scheduled medications to p.r.n. We have made multiple adjustments to her medication regimen. As above, she was on 28 medications at the time of admission and is being discharged on 12 medications that she will be receiving at baseline with narcotics and benzodiazepines changed to p.r.n. basis. She will also be on an antibiotic at the time of discharge but only needs this for three more days for potential urinary tract infection.  2. Diabetes mellitus with hypoglycemia on admission for which she required D5 infusion. She was also on Levemir and high doses of Apidra prior to admission and these were stopped. With these measures her sugars thereafter started to rise. Thereafter her D5 infusion was stopped and she has been started on Lantus and will also be maintained on sliding scale insulin NovoLog. After doing so blood sugars have been better controlled. Overall, her diabetes could be better controlled with hemoglobin A1c of 7.8 and this will be deferred to her primary care physician.  3. Hyperkalemia-This was very mild and there were no acute changes noted on telemetry and after giving her a dose of Lasix her hyperkalemia has resolved  and has not recurred.  4. Chronic systolic congestive heart failure with systolic and diastolic dysfunction. Previous ejection fraction 35%, repeat ejection fraction 35% to 45% during this  hospitalization. With initial chest x-ray showing some pulmonary vascular congestion and pulmonary edema for which has been maintained on Lasix with improvement of her congestion on repeat chest x-ray. For her congestive heart failure, she will be maintained on Lasix and beta blocker therapy for now. Will not keep her on an ACE inhibitor for now given renal insufficiency and hyperkalemia noted earlier and her blood pressure is currently normotensive and would not want to predispose her to any hypotension. She can continue to follow up with her cardiologist upon discharge for further recommendations. 5. Anemia-Likely chronic. She also has a history of lymphoma as well as renal insufficiency. Stool was negative for occult blood. She has remained hemodynamically stable and has asymptomatic anemia and there were no indications for blood transfusion during this hospitalization and she will need to have her hemoglobin and hematocrit closely monitored as an outpatient by her primary care physician.  6. Renal insufficiency-Likely chronic kidney disease. Looking back in our system her creatinine has fluctuated between 1.3 to 1.6 and she may have underlying chronic kidney disease and is currently nonoliguric and we advised avoiding nephrotoxins and following her renal function closely and if her renal function starts to worsen would recommend referral to nephrology as an outpatient.  7. Possible urinary tract infection-Based off abnormal urinalysis for which she has been on empiric IV antibiotics during this hospitalization. Urine cultures have not revealed any growth to date and she has been transitioned over to oral antibiotics and has three more days remaining at the time of discharge to complete a total seven-day course. 8. Paroxysmal atrial fibrillation-Patient has been predominantly in normal sinus rhythm during this hospitalization. She was maintained on metoprolol for heart rate control and Coumadin for CVA  prophylaxis. INR has been mostly therapeutic, slightly supratherapeutic on the day of discharge for which her Coumadin dose has been reduced and recommend repeat PT/INR in the next two to three days per her primary care physician. She also has history of permanent pacemaker insertion.  9. Hypertension-Blood pressure well controlled on the day of discharge. She is currently normotensive on metoprolol and Lasix. We been holding her Norvasc and also she is not on an ACE inhibitor at this time given underlying renal insufficiency and hyperkalemia noted earlier and also since she is normotensive would not want to predispose her to hypotension.  10. On 04/19/2012 patient is hemodynamically stable and felt to be stable for discharge back to [facility] skilled nursing facility with close outpatient follow up to which patient was agreeable.    TIME SPENT ON DISCHARGE: Greater than 30 minutes.   ____________________________ Elon Alas, MD knl:cms D: 04/19/2012 10:32:39 ET T: 04/19/2012 11:02:56 ET JOB#: [id number]  cc: Elon Alas, MD, <Dictator> [facility]  Elon Alas MD ELECTRONICALLY SIGNED 04/26/2012 18:52

## 2015-04-21 NOTE — Consult Note (Signed)
    Comments   Pt seen. She is now awake, able to tell me where she is, where she lives, the names of all of her 5 children and the address of one of her sons. Altered mental status secondary to hypoglycemia now corrected and then to meds given in ER for agitation (lorazepam and haldol). Pt to return to [facility] at discharge. Pt may qualify for hospice services but [facility] does not allow hospice agencies to come into their facility. Note that pt is on 22 scheduled meds (including 2 benzos one bid and one tid), 2 prn meds (including an opiate) and a complicated insulin regimen. It would seem prudent for pt's PCP to try to decrease # of medications in this elderly pt.   Electronic Signatures: Stark Aguinaga, Harriett SineNancy (MD)  (Signed 19-Apr-13 11:13)  Authored: Palliative Care   Last Updated: 19-Apr-13 11:13 by Shaira Sova, Harriett SineNancy (MD)

## 2015-04-21 NOTE — H&P (Signed)
PATIENT NAME:  Dawn Johns, Dawn Johns MR#:  [id number] DATE OF BIRTH:  05/25/1933  DATE OF ADMISSION:  04/15/2012  REASON FOR HOSPITAL VISIT: Patient transferred from nursing home where she was appearing to be incoherent, lethargic.   HISTORY OF PRESENT ILLNESS: This is a 80 year old Caucasian female with significant past medical and surgical history of: 1. Lymphoma which is currently presumably under remission.  2. Diabetes mellitus type 2, patient on complex insulin regimen.  3. Paroxysmal atrial fibrillation. Patient on Coumadin.  4. Glaucoma.  5. History of depression and confusion in the past.  6. Early dementia.  7. Coronary artery disease.  8. Vertigo.  9. Hypothyroidism.  10. Hypertension.  11. History of pacemaker.  12. Prior hysterectomy. 13. History of left bundle branch block as per EKG from 2012 and 2011.  14. Status post left femoral internal fixation.   Much of the history is obtained by interviewing the ER physician, Dr. Darnelle Catalan, patient's nurse in the ER, patient's son, who is at bedside.   Patient has received Ativan and Haldol in the ER for agitation and currently is completely unresponsive.   According to chart review, interview of the people dictated above patient at the nursing facility was appearing lethargic and somewhat cyanotic, she was then sent to the ER where she was found to have a blood glucose of 40, ABGs were insignificant except her pO2 was slightly low at 78 on room air, patient was then applied 2 liters nasal cannula oxygen thereafter her pulse oximetry remained in high 90s, patient herself was never complaining of any chest pain or shortness of breath, she was found to be hypoglycemic, she was given amp of D50 with good results, patient started to wake up but got agitated, thereafter she received 5 mg of IV Haldol and 0.5 mg of Ativan causing her to be completely somnolent and unresponsive. I was requested to admit the patient as she had become unresponsive and  not stable to be transferred back to nursing facility.   REVIEW OF SYSTEMS: Unobtainable.   PAST MEDICAL AND SURGICAL HISTORY: By review of the chart as above.   SOCIAL HISTORY: She has quit smoking several years ago. Four children who live in the area; again this is by chart review.  FAMILY HISTORY: Unobtainable.   HOME MEDICATIONS: 1. Tylenol 325 mg p.o. q.6 p.r.n.  2. Oxycodone hydrochloride 5 mg p.o. q.6 p.r.n.  3. Ambien 5 mg p.o. at bedtime. 4. Flonase 0.05% nasal spray to each nostril once b.i.d.  5. Ativan 0.5 mg p.o. t.i.d.  6. Soapsuds enema p.r.n.  7. Alphagan 0.25% eyedrops one drop to each eye b.i.d.  8. Lamictal 100 mg p.o. daily. 9. Apidra scale with each meal. 10. Xanax 0.25 mg half pill p.o. b.i.d.  11. Celexa 20 mg p.o. at bedtime. 12. Levemir 24 mg subcutaneous at bedtime. 13. Trazodone 50 mg p.o. at bedtime. 14. Mirapex 0.125 mg one pill at bedtime.  15. Lipitor 20 mg p.o. daily. 16. Calcium and vitamin D 500/200 international units, 1 pill b.i.d.  17. Metoprolol 25 mg p.o. q.8 hours. 18. Coumadin 5 mg p.o. daily. 19. Apidra 16 units subcutaneous at supper. 20. Prilosec 20 mg p.o. daily.  21. Levemir 50 units sub-Q q.a.m.  22. Lasix 40 mg p.o. daily. 23. Apidra 12 units sub-Q at breakfast. 24. Claritin 10 mg p.o. daily. 25. Synthroid 75 mcg p.o. daily. 26. Lisinopril 10 mg p.o. daily. 27. Norvasc 5 mg p.o. daily. 28. Risperdal 0.5 mg p.o. b.i.d.  29. Oxygen 2 liters at bedtime.   ALLERGIES: Atarax, bacitracin, codeine, hydromorphone, morphine, neomycin, Tylenol.    PHYSICAL EXAMINATION:  VITAL SIGNS: Temperature 98.4, pulse 84, respirations 19, blood pressure 138/75, 97% on 2 liters nasal cannula. Last Accu-Chek was 72, bedside.   GENERAL: Obese, elderly, Caucasian female lying in hospital bed somnolent, unable to follow commands or answer questions.   HEENT: Normocephalic, atraumatic head. Pupils equal, reactive to light. Pink and moist tongue and  throat.   NECK: Supple neck. She does have JVD.   CENTRAL NERVOUS SYSTEM: Unobtainable.   PSYCH EXAM: Unobtainable. Patient does not appear to have any focal neurological deficits as she is moving all four extremities by herself although son states that she has past history of stroke.   CHEST WALL: Movement is symmetrical. She does have good air movement bilaterally, but bibasilar rales.   CARDIOVASCULAR: Regular rate, rhythm. Normal S1, S2. No gallops, rubs or murmurs.   ABDOMEN: Obese, soft. Positive bowel sounds. No edema.   EXTREMITIES: No cyanosis, clubbing, 1+ edema. Right leg appears to be larger than the left.   SKIN: No skin rashes or bruises.   MUSCULOSKELETAL: No joint effusions or signs of any joint abnormality on external exam.   LABORATORY, DIAGNOSTIC AND RADIOLOGICAL DATA: White count 6.9, hemoglobin 8.9, hematocrit 27.9, platelets 293, INR 3.2, d-dimer 0.78, unclear why it was obtained in the ER, where defer management to the ER physician.   Urinalysis: 3 WBCs, trace leukocyte esterase, trace, bacteria. ABG: pH 7.42, pCO2 46, pO2 78, currently on nasal cannula 97%. Sodium 140, potassium 4.4, chloride 105, bicarbonate 28, BUN 17, creatinine 1.3. BNP 8684. EKG reveals sinus rhythm with left bundle branch block which is old, rate 79 beats per minute. Chest x-ray reveals obese body habitus with under penetrated x-ray, vascular congestion, cardiomegaly, left more than right pleural effusion.   ASSESSMENT AND PLAN:  1. Persistent hyperglycemia in a patient who is on a complex insulin regimen. Plan at this time is to discontinue all insulin and hyperglycemic agents. Patient has been started on D5 drip in the ER for persistently low sugars. I will give her one time IV dose of glucagon and put her on q.2 hour Accu-Cheks and continue the D5 drip. Will obtain A1c. Patient will benefit from simplifying her insulin regimen which could be Lantus 25 units subcutaneous b.i.d. with NovoLog  sliding scale then to be adjusted as needed.  2. History of atrial fibrillation, paroxysmal. Patient currently on Coumadin and sinus rhythm. No acute issues. Will keep her n.p.o. due to decreased mental status. IV Lopressor p.r.n., will continue Coumadin, pharmacy to dose and monitor INR, goal 2 to 3.  3. Congestive heart failure, acute on chronic. BNP is elevated. Patient does have signs of fluid overload. Will obtain an echogram. Will give her 40 units of IV Lasix. For now her lisinopril will be held as she has acute renal insufficiency. Her baseline creatinine appears to between 1 and 1.4, last in the system being 1.05.  4. Right more than left leg swelling. Will obtain Doppler ultrasound. Of note, patient clinically does not have any signs of PE. If large blood clot is revealed on ultrasound consideration of IVC filter should be given as patient is currently therapeutic on her Coumadin dose.  5. Decreased mental status due to medication effect and hypoglycemia (patient was given Haldol and Ativan in the ER). Will withhold all benzodiazepines and minimize narcotic use. Patient will be monitored on a tele bed with fall  and aspiration precautions.  6. Acute renal insufficiency. Baseline creatinine appears to be 1.05. This could be due to congestive heart failure decompensation. Gentle Lasix one time, avoid ACE inhibitors and NSAIDs. Monitor creatinine again in the morning.  7. Possible mild urinary tract infection. Will follow cultures. Will place patient on Rocephin.  8. Anemia, unclear etiology. Patient is on Coumadin. Will check fecal occult blood. Will check B12 and folate acid levels along with an iron panel. Monitor chemistries and hemoglobin and hematocrit.  9. History of hypothyroidism. No acute issues. Continue Synthroid once she is taking p.o. Will check TSH.  10. Patient will be currently kept n.p.o. Due to her decreased mental status, will have speech therapy evaluate the patient in the  morning.  11. Patient will continue Coumadin for deep vein thrombosis prophylaxis.  12. Have discussed with patient's son bedside. She is DO NOT RESUSCITATE.    ____________________________ Stanford Scotland. Thedore Mins, MD pks:cms D: 04/15/2012 01:18:03 ET T: 04/15/2012 05:59:13 ET JOB#: [id number]  cc: Bess Harvest K. Thedore Mins, MD, <Dictator> Stanford Scotland [facility] MD ELECTRONICALLY SIGNED 04/15/2012 8:33
